# Patient Record
Sex: Female | Born: 1965 | Race: White | Hispanic: No | Marital: Married | Smoking: Former smoker
Health system: Southern US, Community
[De-identification: ages and names within clinical notes are randomized; demographics above are authoritative.]

## PROBLEM LIST (undated history)

## (undated) DIAGNOSIS — F909 Attention-deficit hyperactivity disorder, unspecified type: Secondary | ICD-10-CM

## (undated) DIAGNOSIS — K429 Umbilical hernia without obstruction or gangrene: Secondary | ICD-10-CM

## (undated) DIAGNOSIS — K219 Gastro-esophageal reflux disease without esophagitis: Secondary | ICD-10-CM

## (undated) DIAGNOSIS — F329 Major depressive disorder, single episode, unspecified: Secondary | ICD-10-CM

## (undated) DIAGNOSIS — F32A Depression, unspecified: Secondary | ICD-10-CM

## (undated) DIAGNOSIS — A630 Anogenital (venereal) warts: Secondary | ICD-10-CM

## (undated) HISTORY — DX: Attention-deficit hyperactivity disorder, unspecified type: F90.9

## (undated) HISTORY — DX: Anogenital (venereal) warts: A63.0

## (undated) HISTORY — DX: Major depressive disorder, single episode, unspecified: F32.9

## (undated) HISTORY — DX: Gastro-esophageal reflux disease without esophagitis: K21.9

## (undated) HISTORY — PX: CHOLECYSTECTOMY: SHX55

## (undated) HISTORY — DX: Depression, unspecified: F32.A

## (undated) HISTORY — PX: UMBILICAL HERNIA REPAIR: SHX2598

---

## 2005-10-14 ENCOUNTER — Ambulatory Visit: Payer: Self-pay | Admitting: Obstetrics and Gynecology

## 2005-11-23 ENCOUNTER — Ambulatory Visit: Payer: Self-pay | Admitting: Obstetrics and Gynecology

## 2006-04-18 HISTORY — PX: ENDOMETRIAL ABLATION: SHX621

## 2007-10-30 ENCOUNTER — Ambulatory Visit: Payer: Self-pay | Admitting: Obstetrics and Gynecology

## 2007-10-30 ENCOUNTER — Encounter: Payer: Self-pay | Admitting: Family Medicine

## 2008-01-23 ENCOUNTER — Encounter: Payer: Self-pay | Admitting: Family Medicine

## 2008-11-06 ENCOUNTER — Ambulatory Visit: Payer: Self-pay | Admitting: Obstetrics and Gynecology

## 2009-04-29 ENCOUNTER — Encounter: Payer: Self-pay | Admitting: Family Medicine

## 2009-10-28 LAB — CONVERTED CEMR LAB: Pap Smear: NORMAL

## 2009-11-03 ENCOUNTER — Encounter: Payer: Self-pay | Admitting: Family Medicine

## 2009-11-24 ENCOUNTER — Ambulatory Visit: Payer: Self-pay | Admitting: Obstetrics and Gynecology

## 2009-12-09 ENCOUNTER — Ambulatory Visit: Payer: Self-pay | Admitting: Obstetrics and Gynecology

## 2010-03-25 ENCOUNTER — Ambulatory Visit: Payer: Self-pay | Admitting: Family Medicine

## 2010-03-25 DIAGNOSIS — F329 Major depressive disorder, single episode, unspecified: Secondary | ICD-10-CM

## 2010-03-25 DIAGNOSIS — N390 Urinary tract infection, site not specified: Secondary | ICD-10-CM | POA: Insufficient documentation

## 2010-03-25 LAB — CONVERTED CEMR LAB
Bilirubin Urine: NEGATIVE
Glucose, Urine, Semiquant: NEGATIVE
Urobilinogen, UA: 0.2
pH: 6.5

## 2010-03-26 ENCOUNTER — Telehealth: Payer: Self-pay | Admitting: Family Medicine

## 2010-03-29 ENCOUNTER — Encounter: Payer: Self-pay | Admitting: Family Medicine

## 2010-04-13 ENCOUNTER — Ambulatory Visit: Payer: Self-pay | Admitting: Psychology

## 2010-04-16 ENCOUNTER — Ambulatory Visit
Admission: RE | Admit: 2010-04-16 | Discharge: 2010-04-16 | Payer: Self-pay | Source: Home / Self Care | Attending: Psychology | Admitting: Psychology

## 2010-04-21 ENCOUNTER — Encounter: Payer: Self-pay | Admitting: Family Medicine

## 2010-04-21 ENCOUNTER — Ambulatory Visit
Admission: RE | Admit: 2010-04-21 | Discharge: 2010-04-21 | Payer: Self-pay | Source: Home / Self Care | Attending: Psychology | Admitting: Psychology

## 2010-04-22 ENCOUNTER — Telehealth: Payer: Self-pay | Admitting: Family Medicine

## 2010-04-27 ENCOUNTER — Telehealth: Payer: Self-pay | Admitting: Family Medicine

## 2010-04-29 ENCOUNTER — Telehealth: Payer: Self-pay | Admitting: Family Medicine

## 2010-04-29 ENCOUNTER — Ambulatory Visit: Admit: 2010-04-29 | Payer: Self-pay | Admitting: Family Medicine

## 2010-05-05 ENCOUNTER — Telehealth: Payer: Self-pay | Admitting: Family Medicine

## 2010-05-14 ENCOUNTER — Telehealth: Payer: Self-pay | Admitting: Family Medicine

## 2010-05-17 ENCOUNTER — Telehealth: Payer: Self-pay | Admitting: Family Medicine

## 2010-05-19 NOTE — Assessment & Plan Note (Signed)
Summary: NEW PT TO ESTABH/DLO   Vital Signs:  Patient profile:   45 year old female Height:      64 inches Weight:      231.50 pounds BMI:     39.88 Temp:     97.4 degrees F oral Pulse rate:   76 / minute Pulse rhythm:   regular BP sitting:   110 / 78  (right arm) Cuff size:   regular  Vitals Entered By: Linde Gillis CMA Duncan Dull) (March 25, 2010 10:54 AM) CC: new patient, ? UTI   History of Present Illness: 45 yo here to establish care.  1.  ?UTI- last 3 days increased urinary frequency, urgency, and dysuria.  No hematuria, back pain, nausea, vomiting or fevers.   Does not have h/o recurrent UTIs.  No vaginal discharge or irritation.  2.  ADHD- diagnosed as a teenager but has never been on treatment for it.  Both of her children are on Focalin, formally evaluated through psych. Has a new teaching job, difficulty completing tasks and focusing.  Would like to try a stimulant.  3.  Depression- started Lexapro 20 mg last year, vast improvement in her symptoms.  No anxiety or tearfulness.  Less irritability.  Preventive Screening-Counseling & Management  Alcohol-Tobacco     Smoking Status: quit  Current Medications (verified): 1)  Lexapro 20 Mg Tabs (Escitalopram Oxalate) .... Take One Tablet By Mouth Daily 2)  Cyanocobalamin 1000 Mcg/ml Soln (Cyanocobalamin) .... One Injection Every 14 Days 3)  Focalin Xr 10 Mg Xr24h-Cap (Dexmethylphenidate Hcl) .Marland Kitchen.. 1 Tab By Mouth Every Morning. 4)  Bactrim Ds 800-160 Mg Tabs (Sulfamethoxazole-Trimethoprim) .Marland Kitchen.. 1 Tab By Mouth Two Times A Day X 3 Days 5)  Pyridium 100 Mg Tabs (Phenazopyridine Hcl) .Marland Kitchen.. 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 Eyes:  Denies blurring. ENT:  Denies difficulty swallowing. CV:  Denies chest pain or discomfort. Resp:  Denies shortness of breath. GI:  Complains of abdominal pain; denies change in bowel habits, diarrhea, nausea, and vomiting. GU:  Complains of dysuria and urinary frequency; denies discharge, hematuria, and incontinence. MS:  Denies joint pain, joint redness, and joint swelling. Derm:  Denies rash. Neuro:  Denies headaches. Psych:  Denies anxiety, depression, sense of great danger, suicidal thoughts/plans, thoughts of violence, unusual visions or sounds, and thoughts /plans of harming others. Endo:  Denies cold intolerance and heat intolerance. Heme:  Denies abnormal bruising and bleeding.  Physical Exam  General:  alert, well-developed, and overweight-appearing.   Head:  normocephalic, atraumatic, no abnormalities observed, and no abnormalities palpated.   Eyes:  vision grossly intact, pupils equal, pupils round, and pupils reactive to light.   Ears:  R ear normal and L ear normal.   Nose:  no external deformity.  Mouth:  good dentition and no gingival abnormalities.   Neck:  No deformities, masses, or tenderness noted. Lungs:  Normal respiratory effort, chest expands symmetrically. Lungs are clear to auscultation, no crackles or wheezes. Heart:  Normal rate and regular rhythm. S1 and S2 normal without gallop, murmur, click, rub or other extra sounds. Abdomen:  Bowel sounds positive,abdomen soft and non-tender without masses,  organomegaly or hernias noted. Msk:  No deformity or scoliosis noted of thoracic or lumbar spine.   Extremities:  no edema Neurologic:  alert & oriented X3 and gait normal.   Skin:  Intact without suspicious lesions or rashes Psych:  Cognition and judgment appear intact. Alert and cooperative with normal attention span and concentration. No apparent delusions, illusions, hallucinations   Impression & Recommendations:  Problem # 1:  ATTENTION DEFICIT DISORDER (ICD-314.00) Assessment Deteriorated Will start stimulant, focalin. Pt signed controlled substance contract. Follow up in one month.  Problem # 2:  UTI (ICD-599.0) Assessment: New UA pos for UTI. Treat with 3 day course of Bactrim and Pyridium for uncomplicated cystitis. Her updated medication list for this problem includes:    Bactrim Ds 800-160 Mg Tabs (Sulfamethoxazole-trimethoprim) .Marland Kitchen... 1 tab by mouth two times a day x 3 days    Pyridium 100 Mg Tabs (Phenazopyridine hcl) .Marland Kitchen... 1 tab by mouth three times a day x 2 days  Problem # 3:  DEPRESSION (ICD-311) Assessment: Unchanged Appears stable on Lexapro 20 mg daily. Her updated medication list for this problem includes:    Lexapro 20 Mg Tabs (Escitalopram oxalate) .Marland Kitchen... Take one tablet by mouth daily  Complete Medication List: 1)  Lexapro 20 Mg Tabs (Escitalopram oxalate) .... Take one tablet by mouth daily 2)  Cyanocobalamin 1000 Mcg/ml Soln (Cyanocobalamin) .... One injection every 14 days 3)  Focalin Xr 10 Mg Xr24h-cap (Dexmethylphenidate hcl) .Marland Kitchen.. 1 tab by mouth every morning. 4)  Bactrim Ds 800-160 Mg Tabs (Sulfamethoxazole-trimethoprim) .Marland Kitchen.. 1 tab by mouth two times a day x 3 days 5)  Pyridium 100 Mg Tabs (Phenazopyridine hcl) .Marland Kitchen.. 1 tab by mouth three times a day x 2 days  Other Orders: UA Dipstick w/o Micro (manual) (16109) Prescriptions: PYRIDIUM 100 MG TABS (PHENAZOPYRIDINE HCL) 1 tab by mouth three times a day x 2 days  #6 x 0   Entered and Authorized by:    Ruthe Mannan MD   Signed by:   Ruthe Mannan MD on 03/25/2010   Method used:   Print then Give to Patient   RxID:   [id number] BACTRIM DS 800-160 MG TABS (SULFAMETHOXAZOLE-TRIMETHOPRIM) 1 tab by mouth two times a day x 3 days  #6 x 0   Entered and Authorized by:   Ruthe Mannan MD   Signed by:   Ruthe Mannan MD on 03/25/2010   Method used:   Print then Give to Patient   RxID:   [id number] FOCALIN XR 10 MG XR24H-CAP (DEXMETHYLPHENIDATE HCL) 1 tab by mouth every morning.  #30 x 0   Entered and Authorized by:   Ruthe Mannan MD   Signed by:   Ruthe Mannan MD on 03/25/2010   Method used:   Print then Give to Patient   RxID:   [id number]    Orders Added: 1)  UA Dipstick w/o Micro (manual) [81002] 2)  New Patient Level III [25366]    Current Allergies (reviewed today): No known allergies   Laboratory Results   Urine Tests  Date/Time Received: March 25, 2010 11:06 AM   Routine Urinalysis  Color: yellow Appearance: Cloudy Glucose: negative   (Normal Range: Negative) Bilirubin: negative   (Normal Range: Negative) Ketone: negative   (Normal Range: Negative) Spec. Gravity: <1.005   (Normal Range: 1.003-1.035) Blood: moderate   (Normal Range: Negative) pH: 6.5   (Normal Range: 5.0-8.0) Protein: trace   (Normal Range: Negative) Urobilinogen: 0.2   (Normal Range: 0-1) Nitrite: negative   (Normal Range: Negative) Leukocyte Esterace: moderate   (Normal Range: Negative)       Flu Vaccine Result Date:  02/23/2010 Flu Vaccine Result:  historical TD Result Date:  03/28/2007 TD Result:  historical PAP Result Date:  10/28/2009 PAP Result:  normal, historical PAP Next Due:  3 yr Mammogram Result Date:  10/22/2009 Mammogram Result:  normal, historical Mammogram Next Due:  1 yr

## 2010-05-20 NOTE — Progress Notes (Signed)
Summary: concerta  Phone Note Call from Patient Call back at Work Phone [phone]   Caller: Patient Call For: Ruthe Mannan MD Summary of Call: Patient is taking the concerta 10 mg, she says that she is able to tell a difference, but feels that she needs more. She is asking if she could take 20 mg instead of 10mg . Please advise.  Initial call taken by: Melody Comas,  May 05, 2010 11:52 AM  Follow-up for Phone Call        she could try to double up, if effective call us back for rx. Ruthe Mannan MD  May 05, 2010 12:10 PM  Advised pt. Follow-up by: Lowella Petties CMA, AAMA,  May 05, 2010 3:09 PM

## 2010-05-20 NOTE — Progress Notes (Signed)
Summary: prior auth denied for focalin  Phone Note Other Incoming   Caller: [facility] Summary of Call: Prior Berkley Harvey was denied for focalin because at the time the request was sent in pt had not yet been evaluated by psych.  I changed the answer on the request after she was evaluated but they dont re-review the request, we will need to do an appeal.  The address to mail the appeal to is [facility], [address] [address], [place] [address].  Attn Appeals and Grievences. Initial call taken by: Lowella Petties CMA, AAMA,  April 27, 2010 9:55 AM  Follow-up for Phone Call        [facility], please follow up.  Pt was evaluated with psych so appeal should be easy. thanks. Ruthe Mannan MD  April 27, 2010 10:35 AM     Additional Follow-up for Phone Call Additional follow up Details #1::        I would call Ms. Kunsman to see if she would be ok trying adderall first since it is generic.  Not sure we can get this approved if they want to know what she has tried in past since she has not tried anything. Ruthe Mannan MD  April 27, 2010 11:28 AM  Left message on machine at home for patient to return call.  Linde Gillis CMA Duncan Dull)  April 27, 2010 12:01 PM   Left message on machine at home for patient to return call.  Linde Gillis CMA Duncan Dull)  April 28, 2010 8:39 AM   Patient called back and would like to try Concerta instead of Adderall, but would be willing to try Adderall if Concerta is not appropriate.  Uses [facility] pharmacy.  Please advise.   Additional Follow-up by: Linde Gillis CMA Duncan Dull),  April 28, 2010 12:04 PM    Additional Follow-up for Phone Call Additional follow up Details #2::    Generic for concerta filled, rx for pt to pick up. Ruthe Mannan MD  April 28, 2010 12:07 PM  Patient notified as instructed via telephone, Rx left at front desk for pick up.  Follow-up by: Linde Gillis CMA Duncan Dull),  April 28, 2010 12:16 PM  New/Updated Medications: METHYLPHENIDATE HCL 10 MG  TABS (METHYLPHENIDATE HCL) 1 tab by mouth daily. Prescriptions: METHYLPHENIDATE HCL 10 MG TABS (METHYLPHENIDATE HCL) 1 tab by mouth daily.  #30 x 0   Entered and Authorized by:   Ruthe Mannan MD   Signed by:   Ruthe Mannan MD on 04/28/2010   Method used:   Print then Give to Patient   RxID:   [id number]

## 2010-05-20 NOTE — Progress Notes (Signed)
Summary: regarding concerta  Phone Note From Pharmacy   Caller: [facility] Summary of Call: Pharmacy is asking if you meant to prescribe concerta or ritalin, I told them concerta, but that doesnt come in 10 mg's.  Please advise.          Lowella Petties CMA, AAMA  April 29, 2010 10:06 AM   Follow-up for Phone Call        ritalin is fine, they are both  methylphenidate and 10 mg is appropriate. Ruthe Mannan MD  April 29, 2010 10:26 AM    Prior Berkley Harvey approval given over the phone, approval letter placed on doctor's desk for signature and scanning.  Advised pharmacy. Follow-up by: Lowella Petties CMA, AAMA,  May 03, 2010 8:18 AM

## 2010-05-20 NOTE — Progress Notes (Signed)
Summary: dx'd with ADD  Phone Note Call from Patient Call back at [phone], [phone]   Caller: Patient Summary of Call: Pt states she saw Dr. Laymond Purser and was evaluated for ADD, and was dx'd as having it.  I re-faxed the prior auth form for focalin to [facility].       Lowella Petties CMA, AAMA  April 22, 2010 12:30 PM   Follow-up for Phone Call        ok. thank you. Ruthe Mannan MD  April 22, 2010 1:36 PM

## 2010-05-20 NOTE — Progress Notes (Signed)
Summary: concerta  Phone Note Call from Patient Call back at Work Phone [phone]   Caller: Patient Call For: Ruthe Mannan MD Summary of Call: Patient is now taking 30 mg of concerta she says that it is really helping. Patient uses [facility] in [place].  Initial call taken by: Melody Comas,  May 14, 2010 11:40 AM  Follow-up for Phone Call        Rx left at front desk for patient pick up.  Patient notified via telephone. Follow-up by: Linde Gillis CMA Duncan Dull),  May 14, 2010 11:46 AM    New/Updated Medications: CONCERTA 36 MG CR-TABS (METHYLPHENIDATE HCL) 1 tab by mouth daily. Prescriptions: CONCERTA 36 MG CR-TABS (METHYLPHENIDATE HCL) 1 tab by mouth daily.  #30 x 0   Entered and Authorized by:   Ruthe Mannan MD   Signed by:   Ruthe Mannan MD on 05/14/2010   Method used:   Print then Give to Patient   RxID:   [id number]

## 2010-05-20 NOTE — Medication Information (Signed)
Summary: Declined/[facility]  Declined/[facility]   Imported By: Lester Clayton 04/09/2010 08:50:44  _____________________________________________________________________  External Attachment:    Type:   Image     Comment:   External Document

## 2010-05-20 NOTE — Miscellaneous (Signed)
Summary: Controlled Substance Agreement  Controlled Substance Agreement   Imported By: Lanelle Bal 04/01/2010 12:51:54  _____________________________________________________________________  External Attachment:    Type:   Image     Comment:   External Document

## 2010-05-20 NOTE — Progress Notes (Signed)
Summary: prior Krystal Mays is needed for focalin  Phone Note From Pharmacy   Caller: [facility] Summary of Call: Prior auth is needed for focalin, form is on your desk. Initial call taken by: Lowella Petties CMA, AAMA,  March 26, 2010 4:10 PM  Follow-up for Phone Call        In my box. Ruthe Mannan MD  March 29, 2010 7:32 AM  Form faxed.       Lowella Petties CMA, AAMA  March 29, 2010 8:56 AM   Additional Follow-up for Phone Call Additional follow up Details #1::        Pt called and said that she received a letter from her insurance stating that focalin has been denied for coverage.  She is asking if she can be changed to something else. I advised her to check with her insurance company to see what they prefer.  Lowella Petties CMA, AAMA  April 02, 2010 4:41 PM     New Problems: ? of ADD (ICD-314.00)   Additional Follow-up for Phone Call Additional follow up Details #2::    agreed.  please let her know I will not be back unti next weds. Ruthe Mannan MD  April 06, 2010 7:13 AM  Pt states her insurance will not approve any stimulants for ADD unless she has been evaluated by psych- which she has not.Please advise on what to do next.  Pt knows that you are out till next week.             Lowella Petties CMA, AAMA  April 06, 2010 4:13 PM   Additional Follow-up for Phone Call Additional follow up Details #3:: Details for Additional Follow-up Action Taken: will place referral for formal eval. Ruthe Mannan MD  April 07, 2010 5:45 AM  Appt has been scheduled.               Lowella Petties CMA, AAMA  April 07, 2010 8:22 AM   New Problems: ? of ADD (ICD-314.00)

## 2010-05-24 ENCOUNTER — Telehealth: Payer: Self-pay | Admitting: Family Medicine

## 2010-05-24 ENCOUNTER — Encounter: Payer: Self-pay | Admitting: Family Medicine

## 2010-05-24 ENCOUNTER — Ambulatory Visit (INDEPENDENT_AMBULATORY_CARE_PROVIDER_SITE_OTHER): Payer: BC Managed Care – PPO | Admitting: Family Medicine

## 2010-05-24 DIAGNOSIS — F988 Other specified behavioral and emotional disorders with onset usually occurring in childhood and adolescence: Secondary | ICD-10-CM | POA: Insufficient documentation

## 2010-05-26 NOTE — Progress Notes (Signed)
Summary: regarding concerta  Phone Note Refill Request Message from:  Pharmacy  Refills Requested: Medication #1:  CONCERTA 36 MG CR-TABS 1 tab by mouth daily.. Concerta is going to need a prior auth, pharmacy is asking if generic ritalin can be given instead, at a higher dose then pt was originally prescribed.  According to chart notes pt is now taking 30 mg's.  Initial call taken by: Lowella Petties CMA, AAMA,  May 17, 2010 9:53 AM Caller: [facility]  Follow-up for Phone Call        please call pt to discuss what she would like to do Ruthe Mannan MD  May 17, 2010 10:03 AM  Patient would like [place] to get prior auth on Concerta.  In the mean time she wants to know if she can go up on her Ritalin dose.  She says that the 30mg  are helping alot but feels she may need the increase.  Please advise.  Will send to Jacki Cones to start PA process.  Linde Gillis CMA Duncan Dull)  May 17, 2010 10:44 AM    Additional Follow-up for Phone Call Additional follow up Details #1::        No I do not feel comfortable increasing it again.  We just increased it. Ruthe Mannan MD  May 17, 2010 10:45 AM  Patient advised as instructed via telephone, she is ok with Dr. Dayton Martes not increasing her Ritalin.  She would just like a refill on Ritalin (generic) until her PA of Concerta is approved.  Linde Gillis CMA Duncan Dull)  May 17, 2010 11:07 AM   Rx left at front desk for pick up, patient advised.  Additional Follow-up by: Linde Gillis CMA (AAMA),  May 17, 2010 11:49 AM    New/Updated Medications: RITALIN LA 30 MG XR24H-CAP (METHYLPHENIDATE HCL) 1 tab by mouth daily Prescriptions: RITALIN LA 30 MG XR24H-CAP (METHYLPHENIDATE HCL) 1 tab by mouth daily  #30 x 0   Entered and Authorized by:   Ruthe Mannan MD   Signed by:   Ruthe Mannan MD on 05/17/2010   Method used:   Print then Give to Patient   RxID:   [id number]

## 2010-05-26 NOTE — Progress Notes (Signed)
Summary: prior Berkley Harvey is needed for concerta  Phone Note From Pharmacy   Caller: [facility] Summary of Call: Prior Berkley Harvey is needed for concerta, form is on your desk. Initial call taken by: Lowella Petties CMA, AAMA,  May 17, 2010 1:17 PM  Follow-up for Phone Call        in my box. Ruthe Mannan MD  May 17, 2010 3:45 PM  Form faxed.                 Lowella Petties CMA, AAMA  May 17, 2010 3:58 PM   Additional Follow-up for Phone Call Additional follow up Details #1::        Prior auth given for concerta, approval letter placed on doctor's desk for signature and scanning. Additional Follow-up by: Lowella Petties CMA, AAMA,  May 18, 2010 9:53 AM

## 2010-05-31 ENCOUNTER — Encounter: Payer: Self-pay | Admitting: Family Medicine

## 2010-06-03 NOTE — Progress Notes (Signed)
Summary: Approval for Ritalin LA  Phone Note Outgoing Call Call back at [phone]   Call placed by: Linde Gillis CMA Duncan Dull),  May 24, 2010 4:46 PM Call placed to: [facility] Summary of Call: Spoke with Rosanne Ashing at [facility] and was advised that Ritalin LA 30mg  is approved and should now go through at patients pharmacy.  Case number [id number].  Called [facility] to make them aware as well. Initial call taken by: Linde Gillis CMA Duncan Dull),  May 24, 2010 4:47 PM

## 2010-06-03 NOTE — Assessment & Plan Note (Signed)
Summary: FOLLOW UP/[facility]    Vital Signs:  Patient profile:   45 year old female Height:      64 inches Weight:      233.50 pounds BMI:     40.23 Temp:     97.7 degrees F oral Pulse rate:   71 / minute Pulse rhythm:   regular BP sitting:   110 / 70  (right arm) Cuff size:   large  Vitals Entered By: Linde Gillis CMA Duncan Dull) (May 24, 2010 4:16 PM)  History of Present Illness: 45 yo here follow up.    2.  ADHD- diagnosed as a teenager but has never been on treatment for it.  Both of her children are on Focalin, formally evaluated through psych. Has a new teaching job, difficulty completing tasks and focusing.    Had formal evaluation by Dr. Laymond Purser, diagnosed with ADD.  Started on Ritalin 10 mg daily but she felt it was not enough.  wanted to try Concerta.  Started Concerta 36 mg daily but having headaches and nausea, feels less focused. Would like to try higher dose of Ritalin or Focalin.   3.  Depression- started Lexapro 20 mg last year, vast improvement in her symptoms.  No anxiety or tearfulness.  Less irritability.  CC: follow up ADD, depression   Current Medications (verified): 1)  Lexapro 20 Mg Tabs (Escitalopram Oxalate) .... Take One Tablet By Mouth Daily 2)  Cyanocobalamin 1000 Mcg/ml Soln (Cyanocobalamin) .... One Injection Every 14 Days 3)  Pyridium 100 Mg Tabs (Phenazopyridine Hcl) .Marland Kitchen.. 1 Tab By Mouth Three Times A Day X 2 Days 4)  Concerta 36 Mg Cr-Tabs (Methylphenidate Hcl) .Marland Kitchen.. 1 Tab By Mouth Daily.  Allergies (verified): No Known Drug Allergies  Orders Added: 1)  Est. Patient Level IV [16109]  Review of Systems      See HPI General:  Denies malaise. Psych:  Denies mental problems, panic attacks, sense of great danger, suicidal thoughts/plans, thoughts of violence, unusual visions or sounds, and thoughts /plans of harming others.   Physical Exam  General:  alert, well-developed, and overweight-appearing.   Psych:  Cognition and judgment appear  intact. Alert and cooperative with normal attention span and concentration. No apparent delusions, illusions, hallucinations   Impression & Recommendations:  Problem # 1:  ADD (ICD-314.00) Assessment Deteriorated Time spent with patient 25 minutes, more than 50% of this time was spent counseling patient on ADD treatment.  Printed out rx for focalin which I then destroyed because pt would prefer to not have to go through another prior authorization battle at this time. Will increase dose of Ritalin (Rx already at pharmacy).  Pt to follow up in one month.  Complete Medication List: 1)  Lexapro 20 Mg Tabs (Escitalopram oxalate) .... Take one tablet by mouth daily 2)  Cyanocobalamin 1000 Mcg/ml Soln (Cyanocobalamin) .... One injection every 14 days 3)  Pyridium 100 Mg Tabs (Phenazopyridine hcl) .Marland Kitchen.. 1 tab by mouth three times a day x 2 days 4)  Concerta 36 Mg Cr-tabs (Methylphenidate hcl) .Marland Kitchen.. 1 tab by mouth daily.  Prescriptions: FOCALIN XR 20 MG XR24H-CAP (DEXMETHYLPHENIDATE HCL) 1 tab by mouth daily.  #30 x 0   Entered and Authorized by:   Ruthe Mannan MD   Signed by:   Ruthe Mannan MD on 05/24/2010   Method used:   Print then Give to Patient   RxID:   [id number]   Current Allergies (reviewed today): No known allergies

## 2010-06-09 NOTE — Letter (Signed)
Summary: [facility]   Imported By: Kassie Mends 06/04/2010 10:55:41  _____________________________________________________________________  External Attachment:    Type:   Image     Comment:   External Document

## 2010-06-09 NOTE — Miscellaneous (Signed)
Summary: prevention update  Clinical Lists Changes  Observations: Added new observation of MAMMO DUE: 10/30/2010 (05/31/2010 13:43) Added new observation of PAP DUE: 11/03/2012 (05/31/2010 13:43) Added new observation of LAST PAP DAT: 11/03/2009 (11/03/2009 13:44) Added new observation of PAP SMEAR: normal (11/03/2009 13:44) Added new observation of LAST MAM DAT: 10/29/2009 (10/29/2009 13:44) Added new observation of MAMMOGRAM: normal (10/29/2009 13:44)     Last PAP:  normal, historical (10/28/2009 11:55:30 AM) PAP Result Date:  11/03/2009 PAP Result:  normal PAP Next Due:  3 yr Last Mammogram:  normal, historical (10/22/2009 11:56:00 AM) Mammogram Result Date:  10/29/2009 Mammogram Result:  normal Mammogram Next Due:  1 yr

## 2010-06-10 ENCOUNTER — Encounter: Payer: Self-pay | Admitting: Family Medicine

## 2010-06-10 ENCOUNTER — Ambulatory Visit (INDEPENDENT_AMBULATORY_CARE_PROVIDER_SITE_OTHER): Payer: BC Managed Care – PPO | Admitting: Family Medicine

## 2010-06-10 ENCOUNTER — Telehealth: Payer: Self-pay | Admitting: Family Medicine

## 2010-06-10 ENCOUNTER — Other Ambulatory Visit: Payer: Self-pay | Admitting: Family Medicine

## 2010-06-10 ENCOUNTER — Ambulatory Visit: Payer: Self-pay | Admitting: Family Medicine

## 2010-06-10 DIAGNOSIS — K8021 Calculus of gallbladder without cholecystitis with obstruction: Secondary | ICD-10-CM | POA: Insufficient documentation

## 2010-06-10 DIAGNOSIS — R1011 Right upper quadrant pain: Secondary | ICD-10-CM

## 2010-06-10 LAB — CBC WITH DIFFERENTIAL/PLATELET
Basophils Relative: 0.4 % (ref 0.0–3.0)
Eosinophils Relative: 1.5 % (ref 0.0–5.0)
Hemoglobin: 14.3 g/dL (ref 12.0–15.0)
Lymphocytes Relative: 26 % (ref 12.0–46.0)
Monocytes Relative: 8.4 % (ref 3.0–12.0)
Neutro Abs: 4.8 10*3/uL (ref 1.4–7.7)
Neutrophils Relative %: 63.7 % (ref 43.0–77.0)
RBC: 4.23 Mil/uL (ref 3.87–5.11)
WBC: 7.5 10*3/uL (ref 4.5–10.5)

## 2010-06-10 LAB — HEPATIC FUNCTION PANEL
ALT: 15 U/L (ref 0–35)
AST: 16 U/L (ref 0–37)
Albumin: 4.4 g/dL (ref 3.5–5.2)
Alkaline Phosphatase: 52 U/L (ref 39–117)
Bilirubin, Direct: 0.1 mg/dL (ref 0.0–0.3)
Total Protein: 6.7 g/dL (ref 6.0–8.3)

## 2010-06-10 LAB — BASIC METABOLIC PANEL
BUN: 18 mg/dL (ref 6–23)
Calcium: 9 mg/dL (ref 8.4–10.5)
Chloride: 110 mEq/L (ref 96–112)
Creatinine, Ser: 0.9 mg/dL (ref 0.4–1.2)
GFR: 72.55 mL/min (ref >60.00)

## 2010-06-10 LAB — LIPASE: Lipase: 29 U/L (ref 11.0–59.0)

## 2010-06-11 ENCOUNTER — Ambulatory Visit: Payer: Self-pay | Admitting: Surgery

## 2010-06-15 NOTE — Progress Notes (Signed)
Summary: call report   Phone Note Call from Patient   Caller: Aggie Cosier  Call For: Ruthe Mannan MD Summary of Call: Aggie Cosier with [facility] - Call Report- abdominal ultrasound showed that there is a Large impacted stone at neck of gallbladder and dilated common bile duct.  Initial call taken by: Melody Comas,  June 10, 2010 10:38 AM  Follow-up for Phone Call        left voicemail for pt to return call.  Will place immediate referral for surgery evaluation. Ruthe Mannan MD  June 10, 2010 10:40 AM  Patient advised as instructed via telephone.  Shirlee Limerick will speak with her to make surgery referral.    Follow-up by: Linde Gillis CMA Duncan Dull),  June 10, 2010 11:05 AM  Additional Follow-up for Phone Call Additional follow up Details #1::        Appt made with Dr Renda Rolls at [facility] for 06/10/2010 at 11:30 notes were faxed to [facility] attn at [phone]. Additional Follow-up by: Carlton Adam,  June 10, 2010 11:14 AM  New Problems: CHOLELITHIASIS, WITH OBSTRUCTION [phone])   Additional Follow-up for Phone Call Additional follow up Details #2::    thank you! Ruthe Mannan MD  June 10, 2010 11:16 AM   New Problems: CHOLELITHIASIS, WITH OBSTRUCTION [phone])

## 2010-06-15 NOTE — Assessment & Plan Note (Signed)
Summary: BACK PAIN/CLE  BCBS   Vital Signs:  Patient profile:   45 year old female Height:      64 inches Weight:      233.50 pounds BMI:     40.23 Temp:     98.4 degrees F oral Pulse rate:   68 / minute Pulse rhythm:   regular BP sitting:   110 / 62  (left arm) Cuff size:   large  Vitals Entered By: Linde Gillis CMA Duncan Dull) (June 10, 2010 8:19 AM) CC: stomach and back pain   History of Present Illness: 45 yo here for acute onset of abdominal/back pain.  Started two days ago after eating chicken wings. Right upper quadrant abdominal pain that radiated to her back. Associated with nausea, no vomiting. Never had anything like this before. No fevers or chills. No diarrhea, blood or changes in her stool. This morning, feels much better.  +FH of gallstones.  Current Medications (verified): 1)  Lexapro 20 Mg Tabs (Escitalopram Oxalate) .... Take One Tablet By Mouth Daily 2)  Cyanocobalamin 1000 Mcg/ml Soln (Cyanocobalamin) .... One Injection Every 14 Days 3)  Omeprazole 20 Mg Cpdr (Omeprazole) .... Take One Tablet By Mouth Daily 4)  Ritalin La 30 Mg Xr24h-Cap (Methylphenidate Hcl) .Marland Kitchen.. 1 Tab By Mouth Every Morning.  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 GI:  Complains of abdominal pain and nausea; denies constipation and vomiting.  Physical Exam  General:  alert, well-developed, and overweight-appearing.   Abdomen:  mildly TTP  diffusely, neg murphy's soft and no hepatomegaly.   Skin:  Intact without suspicious lesions or rashes Psych:  Cognition and judgment appear intact. Alert and cooperative with normal attention span and concentration. No apparent delusions, illusions, hallucinations   Impression & Recommendations:  Problem # 1:  RUQ PAIN (ICD-789.01) Assessment New seems most consistent with biliary colic. Wil check labs like lipase, LFTs, CBC to rule out other causes. Abdominal ultrasound order placed. Orders: Venipuncture (04540) TLB-CBC Platelet - w/Differential (85025-CBCD) TLB-Hepatic/Liver Function Pnl (80076-HEPATIC) TLB-BMP (Basic Metabolic Panel-BMET) (80048-METABOL) TLB-Lipase (83690-LIPASE) Radiology Referral (Radiology)  Complete Medication List: 1)  Lexapro 20 Mg Tabs (Escitalopram oxalate) .... Take one tablet by mouth daily 2)  Cyanocobalamin 1000 Mcg/ml Soln (Cyanocobalamin) .... One injection every 14 days 3)  Omeprazole 20 Mg Cpdr (Omeprazole) .... Take one tablet by mouth daily 4)  Ritalin La 30 Mg Xr24h-cap (Methylphenidate hcl) .Marland Kitchen.. 1 tab by mouth every morning.  Patient Instructions: 1)  Great to see you. 2)  Please stop by to see Shirlee Limerick on your way out. Prescriptions: RITALIN LA 30 MG XR24H-CAP (METHYLPHENIDATE HCL) 1 tab by mouth every morning.  #30 x 0   Entered and Authorized by:   Ruthe Mannan MD   Signed by:   Ruthe Mannan MD on 06/10/2010   Method used:   Print then Give to Patient   RxID:   [id number]    Orders Added: 1)  Venipuncture [08657] 2)  TLB-CBC Platelet - w/Differential [85025-CBCD] 3)  TLB-Hepatic/Liver Function  Pnl [80076-HEPATIC] 4)  TLB-BMP (Basic Metabolic Panel-BMET) [80048-METABOL] 5)  TLB-Lipase [83690-LIPASE] 6)  Radiology Referral [Radiology] 7)  Est. Patient Level IV [16109]    Current Allergies (reviewed today): No known allergies

## 2010-06-15 NOTE — Letter (Signed)
Summary: E-mail from Dr.Perrin to Dr.Jaramie Bastos  E-mail from Dr.Perrin to Dr.Deasiah Hagberg   Imported By: Beau Fanny 06/10/2010 10:40:19  _____________________________________________________________________  External Attachment:    Type:   Image     Comment:   External Document  Appended Document: E-mail from Dr.Perrin to Dr.Chanoch Mccleery ADHD

## 2010-06-29 NOTE — Consult Note (Signed)
Summary: [facility]  [facility]   Imported By: Maryln Gottron 06/24/2010 13:33:03  _____________________________________________________________________  External Attachment:    Type:   Image     Comment:   External Document

## 2010-08-04 ENCOUNTER — Other Ambulatory Visit: Payer: Self-pay | Admitting: *Deleted

## 2010-08-04 MED ORDER — METHYLPHENIDATE HCL ER (LA) 30 MG PO CP24: 30.0000 mg | ORAL_CAPSULE | ORAL | Status: DC

## 2010-08-05 MED ORDER — METHYLPHENIDATE HCL ER (LA) 30 MG PO CP24: 30.0000 mg | ORAL_CAPSULE | ORAL | Status: AC

## 2010-08-05 MED ORDER — METHYLPHENIDATE HCL ER (LA) 30 MG PO CP24: ORAL_CAPSULE | ORAL | Status: DC

## 2010-08-05 NOTE — Telephone Encounter (Signed)
Nikki, has this been taken care of? 

## 2010-08-05 NOTE — Telephone Encounter (Signed)
Left message on cell phone voicemail that Rx's are ready for pick up will be left at front desk.

## 2010-10-12 ENCOUNTER — Other Ambulatory Visit: Payer: Self-pay | Admitting: *Deleted

## 2010-10-12 MED ORDER — METHYLPHENIDATE HCL ER (LA) 30 MG PO CP24: 30.0000 mg | ORAL_CAPSULE | ORAL | Status: DC

## 2010-10-12 NOTE — Telephone Encounter (Signed)
Pt is asking for written script for lexapro 10 mg's, says she had gotten these from her gyn, would like for you to start prescribing.  She will pick up the script when she picks up ritalin script.  She only needs one script for ritalin right now, has appt with you next week so will pick up additional scripts then- she likes to get 3 months at a time.

## 2010-10-13 ENCOUNTER — Other Ambulatory Visit: Payer: Self-pay | Admitting: Family Medicine

## 2010-10-13 DIAGNOSIS — Z0000 Encounter for general adult medical examination without abnormal findings: Secondary | ICD-10-CM

## 2010-10-13 DIAGNOSIS — Z136 Encounter for screening for cardiovascular disorders: Secondary | ICD-10-CM

## 2010-10-13 MED ORDER — ESCITALOPRAM OXALATE 10 MG PO TABS: 10.0000 mg | ORAL_TABLET | Freq: Every day | ORAL | Status: DC

## 2010-10-13 NOTE — Telephone Encounter (Signed)
Patient advised as instructed via telephone, Rx's are ready for pick up will be left at front desk.

## 2010-10-13 NOTE — Telephone Encounter (Signed)
Rx in my box.

## 2010-10-14 ENCOUNTER — Other Ambulatory Visit (INDEPENDENT_AMBULATORY_CARE_PROVIDER_SITE_OTHER): Payer: BC Managed Care – PPO | Admitting: Family Medicine

## 2010-10-14 DIAGNOSIS — Z136 Encounter for screening for cardiovascular disorders: Secondary | ICD-10-CM

## 2010-10-14 DIAGNOSIS — Z0000 Encounter for general adult medical examination without abnormal findings: Secondary | ICD-10-CM

## 2010-10-14 LAB — LIPID PANEL
Cholesterol: 195 mg/dL (ref 0–200)
Total CHOL/HDL Ratio: 5
Triglycerides: 104 mg/dL (ref 0.0–149.0)

## 2010-10-14 LAB — BASIC METABOLIC PANEL
CO2: 24 mEq/L (ref 19–32)
Calcium: 8.8 mg/dL (ref 8.4–10.5)
Creatinine, Ser: 0.8 mg/dL (ref 0.4–1.2)
GFR: 78.44 mL/min (ref >60.00)

## 2010-10-18 ENCOUNTER — Encounter: Payer: Self-pay | Admitting: Family Medicine

## 2010-10-18 LAB — HM MAMMOGRAPHY

## 2010-10-21 ENCOUNTER — Ambulatory Visit (INDEPENDENT_AMBULATORY_CARE_PROVIDER_SITE_OTHER): Payer: BC Managed Care – PPO | Admitting: Family Medicine

## 2010-10-21 ENCOUNTER — Encounter: Payer: Self-pay | Admitting: Family Medicine

## 2010-10-21 VITALS — BP 92/60 | HR 80 | Temp 97.9°F | Ht 64.0 in | Wt 231.5 lb

## 2010-10-21 DIAGNOSIS — F988 Other specified behavioral and emotional disorders with onset usually occurring in childhood and adolescence: Secondary | ICD-10-CM

## 2010-10-21 DIAGNOSIS — Z1231 Encounter for screening mammogram for malignant neoplasm of breast: Secondary | ICD-10-CM

## 2010-10-21 DIAGNOSIS — F329 Major depressive disorder, single episode, unspecified: Secondary | ICD-10-CM

## 2010-10-21 DIAGNOSIS — Z0000 Encounter for general adult medical examination without abnormal findings: Secondary | ICD-10-CM

## 2010-10-21 MED ORDER — OMEPRAZOLE 20 MG PO CPDR: 20.0000 mg | DELAYED_RELEASE_CAPSULE | Freq: Every day | ORAL | Status: DC

## 2010-10-21 MED ORDER — METHYLPHENIDATE HCL ER (LA) 30 MG PO CP24: 30.0000 mg | ORAL_CAPSULE | ORAL | Status: AC

## 2010-10-21 NOTE — Progress Notes (Signed)
45 yo here for CPX.  G2P2, no h/o abnormal pap smears.  Sexually active with husband only. Last pap smear normal 10/2010.   ADHD- symptoms much improved on Ritalin. No CP, SOB or palpitations.   Depression- cut back to Lexapro 10 mg daily instead of 20 mg because felt a little "jittery."  Symptoms much improved.  No anxiety or tearfulness.  Less irritability.   Review of Systems       See HPI Patient reports no  vision/ hearing changes,anorexia, weight change, fever ,adenopathy, persistant / recurrent hoarseness, swallowing issues, chest pain, edema,persistant / recurrent cough, hemoptysis, dyspnea(rest, exertional, paroxysmal nocturnal), gastrointestinal  bleeding (melena, rectal bleeding), abdominal pain, excessive heart burn, GU symptoms(dysuria, hematuria, pyuria, voiding/incontinence  Issues) syncope, focal weakness, severe memory loss, concerning skin lesions, depression, anxiety, abnormal bruising/bleeding, major joint swelling, breast masses or abnormal vaginal bleeding.     Patient Active Problem List  Diagnoses  . DEPRESSION  . UTI  . ADD  . CHOLELITHIASIS, WITH OBSTRUCTION  . RUQ PAIN   Past Medical History  Diagnosis Date  . Depression   . Genital warts   . ADHD (attention deficit hyperactivity disorder)    Past Surgical History  Procedure Date  . Endometrial ablation 2008   History  Substance Use Topics  . Smoking status: Former Games developer  . Smokeless tobacco: Not on file  . Alcohol Use: Yes   Family History  Problem Relation Age of Onset  . Cancer Mother 77    breast   . Cancer Other     breast   Allergies not on file Current Outpatient Prescriptions on File Prior to Visit  Medication Sig Dispense Refill  . cyanocobalamin (,VITAMIN B-12,) 1000 MCG/ML injection One injection every 14 days       . escitalopram (LEXAPRO) 10 MG tablet Take 1 tablet (10 mg total) by mouth daily.  90 tablet  2  . methylphenidate (RITALIN LA) 30 MG 24 hr capsule Take 1 capsule  (30 mg total) by mouth every morning.  30 capsule  0  . omeprazole (PRILOSEC) 20 MG capsule Take 20 mg by mouth daily.         The PMH, PSH, Social History, Family History, Medications, and allergies have been reviewed in [facility], and have been updated if relevant.  Physical Exam BP 92/60  Pulse 80  Temp(Src) 97.9 F (36.6 C) (Oral)  Ht 5\' 4"  (1.626 m)  Wt 231 lb 8 oz (105.008 kg)  BMI 39.74 kg/m2  General:  Well-developed,well-nourished,in no acute distress; alert,appropriate and cooperative throughout examination Head:  normocephalic and atraumatic.   Eyes:  vision grossly intact, pupils equal, pupils round, and pupils reactive to light.   Ears:  R ear normal and L ear normal.   Nose:  no external deformity.   Mouth:  good dentition.   Neck:  No deformities, masses, or tenderness noted. Breasts:  No mass, nodules, thickening, tenderness, bulging, retraction, inflamation, nipple discharge or skin changes noted.   Lungs:  Normal respiratory effort, chest expands symmetrically. Lungs are clear to auscultation, no crackles or wheezes. Heart:  Normal rate and regular rhythm. S1 and S2 normal without gallop, murmur, click, rub or other extra sounds. Abdomen:  Bowel sounds positive,abdomen soft and non-tender without masses, organomegaly or hernias noted. Msk:  No deformity or scoliosis noted of thoracic or lumbar spine.   Extremities:  No clubbing, cyanosis, edema, or deformity noted with normal full range of motion of all joints.   Neurologic:  alert &  oriented X3 and gait normal.   Skin:  Intact without suspicious lesions or rashes Cervical Nodes:  No lymphadenopathy noted Axillary Nodes:  No palpable lymphadenopathy Psych:  Cognition and judgment appear intact. Alert and cooperative with normal attention span and concentration. No apparent delusions, illusions, hallucinations   Assessment and Plan:  1. Routine general medical examination at a health care facility   Reviewed  preventive care protocols, scheduled due services, and updated immunizations Discussed nutrition, exercise, diet, and healthy lifestyle.    2. DEPRESSION  Stable on current dose of lexapro.   3. ADD

## 2010-10-21 NOTE — Patient Instructions (Signed)
Please stop by to see Aram Beecham on your way out. Have a great summer!

## 2010-11-11 ENCOUNTER — Encounter: Payer: Self-pay | Admitting: Family Medicine

## 2010-11-11 ENCOUNTER — Ambulatory Visit (INDEPENDENT_AMBULATORY_CARE_PROVIDER_SITE_OTHER): Payer: BC Managed Care – PPO | Admitting: Family Medicine

## 2010-11-11 VITALS — BP 104/68 | HR 68 | Temp 98.5°F | Wt 231.8 lb

## 2010-11-11 DIAGNOSIS — M545 Low back pain: Secondary | ICD-10-CM

## 2010-11-11 DIAGNOSIS — R109 Unspecified abdominal pain: Secondary | ICD-10-CM

## 2010-11-11 DIAGNOSIS — R1030 Lower abdominal pain, unspecified: Secondary | ICD-10-CM

## 2010-11-11 LAB — POCT URINALYSIS DIPSTICK
Spec Grav, UA: 1.03
Urobilinogen, UA: 0.2

## 2010-11-11 MED ORDER — CIPROFLOXACIN HCL 500 MG PO TABS: 500.0000 mg | ORAL_TABLET | Freq: Two times a day (BID) | ORAL | Status: AC

## 2010-11-11 NOTE — Assessment & Plan Note (Addendum)
Not typical sxs of UTI.  Nor of nephrolithiasis. Check bmp to eval renal fxn. UA with large blood, TNTC RBCs, not many WBC though. Still concern for recurrent infection or incompletely treated. Take 7d course cipro, update [place] if not improving.  If not better, consider renal US Advised to seek care if worsening, or fevers, inability to void or other red flags.

## 2010-11-11 NOTE — Progress Notes (Signed)
  Subjective:    Patient ID: Krystal Mays, female    DOB: 1965/09/25, 45 y.o.   MRN: [id number]  HPI CC: UTI?  Saturday went to urgent care with dysuria, urgency, dx with UTI.  Unsure if they sent culture.  Prescribed septra x 3 days.  sxs slightly improved but never fully better.  Then yesterday started having sxs again - lower abd pain, lower back pain.  Feeling somewhat bloated as well.  Voiding less, not drinking as much as should be.  yest and today worse, abd pain at times cramping and had her doubled in pain.  No dysuria.  Feels like needs to go more often but not voiding as much.  mild nausea.  No shooting pains down into groin.  Never had kidney stones in past.  No blood noted in urine but darker than normal.  S/p endometrial ablation, doesn't have regular periods, not currently on cycle.  No fevers/chills, vomiting, flank pain.  Review of Systems Per HPI    Objective:   Physical Exam  Nursing note and vitals reviewed. Constitutional: She appears well-developed and well-nourished. No distress.  HENT:  Head: Normocephalic and atraumatic.  Mouth/Throat: Oropharynx is clear and moist. No oropharyngeal exudate.  Eyes: Conjunctivae and EOM are normal. Pupils are equal, round, and reactive to light. No scleral icterus.  Neck: Normal range of motion. Neck supple.  Cardiovascular: Normal rate, regular rhythm, normal heart sounds and intact distal pulses.   No murmur heard. Pulmonary/Chest: Effort normal and breath sounds normal. No respiratory distress. She has no wheezes. She has no rales.  Abdominal: Soft. Bowel sounds are normal. She exhibits no distension. There is tenderness (mild) in the suprapubic area. There is no rebound, no guarding and no CVA tenderness.  Musculoskeletal: She exhibits edema (nonpitting).  Skin: Skin is warm and dry. No rash noted.  Psychiatric: She has a normal mood and affect.          Assessment & Plan:

## 2010-11-11 NOTE — Patient Instructions (Signed)
Looks like continued urine infection, however ,I'd like to check other blood work to rule out other problems. Treat with cipro 500mg  twice daily for 7 days, update [place] with how you're feeling. Urine culture sent. Push fluids, plenty of rest. If worsening abdominal pain or fevers, or unable to pass urine, may need to seek urgent evaluation.

## 2010-11-12 LAB — CBC WITH DIFFERENTIAL/PLATELET
Basophils Absolute: 0 10*3/uL (ref 0.0–0.1)
Basophils Relative: 0.5 % (ref 0.0–3.0)
HCT: 38.8 % (ref 36.0–46.0)
Hemoglobin: 13.6 g/dL (ref 12.0–15.0)
Lymphs Abs: 2.1 10*3/uL (ref 0.7–4.0)
MCHC: 35 g/dL (ref 30.0–36.0)
Monocytes Relative: 9.3 % (ref 3.0–12.0)
Neutro Abs: 4.3 10*3/uL (ref 1.4–7.7)
RBC: 4.11 Mil/uL (ref 3.87–5.11)
RDW: 12.8 % (ref 11.5–14.6)

## 2010-11-12 LAB — BASIC METABOLIC PANEL
BUN: 20 mg/dL (ref 6–23)
CO2: 25 mEq/L (ref 19–32)
Calcium: 9.5 mg/dL (ref 8.4–10.5)
Creatinine, Ser: 1 mg/dL (ref 0.4–1.2)

## 2010-11-13 ENCOUNTER — Inpatient Hospital Stay (INDEPENDENT_AMBULATORY_CARE_PROVIDER_SITE_OTHER)
Admission: RE | Admit: 2010-11-13 | Discharge: 2010-11-13 | Disposition: A | Discharge status: Home / Self Care | Payer: BC Managed Care – PPO | Source: Ambulatory Visit | Attending: Family Medicine | Admitting: Family Medicine

## 2010-11-13 DIAGNOSIS — N390 Urinary tract infection, site not specified: Secondary | ICD-10-CM

## 2010-11-13 LAB — POCT URINALYSIS DIP (DEVICE)
Hgb urine dipstick: NEGATIVE
Nitrite: POSITIVE — AB
Urobilinogen, UA: 2 mg/dL — ABNORMAL HIGH (ref 0.0–1.0)
pH: 7 (ref 5.0–8.0)

## 2010-11-13 LAB — URINE CULTURE

## 2010-11-14 LAB — URINE CULTURE

## 2010-11-15 ENCOUNTER — Telehealth: Payer: Self-pay | Admitting: *Deleted

## 2010-11-15 NOTE — Telephone Encounter (Signed)
Left message on machine at home for patient to return call. 

## 2010-11-15 NOTE — Telephone Encounter (Signed)
Her urine was definitely positive in the ER.  I just reviewed it. So she does have a UTI. I would recommend her coming in for ER follow up tomorrow and we can repeat her UA and urine culture to make sure it's sensitive to Keflex since it is apparently resistant to Bactrim.

## 2010-11-15 NOTE — Telephone Encounter (Signed)
Patient advised as instructed via telephone.  Appt scheduled for 11/16/2010 at 11:00.

## 2010-11-15 NOTE — Telephone Encounter (Signed)
Spoke with patient and she stated that she has been on Keflex for for three days and doesn't feel a big difference better.  She still has low back pain, pain in pelvic area, and she gets chills from time to time.  No fever, no burning, no urgency.  She stated that the urine culture Dr. Sharen Hones did was negative.  Please advise.

## 2010-11-15 NOTE — Telephone Encounter (Signed)
[facility] Triage Call Report Triage Record Num: [id number] Operator: Chevis Pretty Patient Name: Krystal Mays Call Date & Time: 11/13/2010 12:25:34PM Patient Phone: [phone] PCP: Patient Gender: Female PCP Fax : Patient DOB: 1965/10/01 Practice Name: [facility] Reason for Call: LMP ~ ablation done, no periods. Patient calling about UTI. Seen in office 11/11/10 and placed on Septra. Has had 5 doses of antibiotic, but symptoms have not improved. States she has not improved in terms of back pain which was present on 11/11/10. Had been on Septra x 3 days 07/21-23/12 and when seen 11/11/10, she had already taken Septra for this. Feels she should feel better by now. Afebrile. Denies emergent symptoms per protocol; advised appt within 24 hours. TC to office; if patient can be at office within 20 min, may be seen; otherwise to UC. Patient not sure if can make office by then; to go to [facility] UC. Protocol(s) Used: Urinary Symptoms - Female Recommended Outcome per Protocol: See Provider within 24 hours Reason for Outcome: Evaluated by provider AND symptoms worsening after following recommended treatment plan for 72 hours Care Advice: ~ Go to the ED IMMEDIATELY if repeated vomiting or severe pain occurs. ~ Call provider if you develop flank or low back pain, fever, generally feel sick. Increase intake of fluids. Try to drink 8 oz. (.2 liter) every hour when awake, including unsweetened cranberry juice, unless on restricted fluids for other medical reasons. Take sips of fluid or eat ice chips if nauseated or vomiting. ~ ~ SYMPTOM / CONDITION MANAGEMENT ~ CAUTIONS Limit carbonated, alcoholic, and caffeinated beverages such as coffee, tea and soda. Avoid nonprescription cold and allergy medications that contain caffeine. Limit intake of tomatoes, fruit juices (except for unsweetened cranberry juice), dairy products, spicy foods, sugar, and artificial sweeteners (aspartame or  saccharine). Stop or decrease smoking. Reducing exposure to bladder irritants may help lessen urgency. ~ Analgesic/Antipyretic Advice - Acetaminophen: Consider acetaminophen as directed on label or by pharmacist/provider for pain or fever PRECAUTIONS: - Use if there is no history of liver disease, alcoholism, or intake of three or more alcohol drinks per day - Only if approved by provider during pregnancy or when breastfeeding - During pregnancy, acetaminophen should not be taken more than 3 consecutive days without telling provider - Do not exceed recommended dose or frequency ~ ~ Call provider if urine is pink, red, smoky or cola colored. Systemic Inflammatory Response Syndrome (SIRS): Watch for signs of a generalized, whole body infection. Occurs within days of a localized infection, especially of the urinary, GI, respiratory or nervous systems; or after a traumatic injury or invasive procedure. - Call EMS 911 if symptoms have worsened, such as increasing confusion or unusual drowsiness; cold and clammy skin; no urine output; rapid respiration (>30/min.) or slow respiration (<10/min.); struggling to breathe. - Go to the ED immediately for early symptoms of rapid pulse >90/min. or rapid breathing >20/min. at rest; chills; oral temperature >100.4 F (38 C) or <96.8 F (36 C) when associated with conditions noted. ~ Analgesic/Antipyretic Advice - NSAIDs: Consider aspirin, ibuprofen, naproxen or ketoprofen for pain or fever as directed on label or by pharmacist/provider. PRECAUTIONS: ~ 11/13/2010 12:38:15PM Page 1 of 2 [id number] [facility] Triage Call Report Patient Name: Krystal Mays continuation page/s - If over 50 years of age, should not take longer than 1 week without consulting provider. EXCEPTIONS: - Should not be used if taking blood thinners or have bleeding problems. - Do not use if have history of sensitivity/allergy to any  of these medications; or history of  cardiovascular, ulcer, kidney, liver disease or diabetes unless approved by provider. - Do not exceed recommended dose or frequency. 11/13/2010 12:38:15PM Page 2 of 2 [email or web address]_[email or web address]_[email or web address]

## 2010-11-15 NOTE — Telephone Encounter (Signed)
Er notes reviewed, changed to Keflex. Please call pt to check up on her.

## 2010-11-16 ENCOUNTER — Encounter: Payer: Self-pay | Admitting: Family Medicine

## 2010-11-16 ENCOUNTER — Ambulatory Visit (INDEPENDENT_AMBULATORY_CARE_PROVIDER_SITE_OTHER): Payer: BC Managed Care – PPO | Admitting: Family Medicine

## 2010-11-16 VITALS — BP 110/70 | HR 73 | Temp 98.8°F | Wt 232.5 lb

## 2010-11-16 DIAGNOSIS — N390 Urinary tract infection, site not specified: Secondary | ICD-10-CM

## 2010-11-16 LAB — POCT URINALYSIS DIPSTICK
Bilirubin, UA: NEGATIVE
Glucose, UA: NEGATIVE
Nitrite, UA: POSITIVE

## 2010-11-16 MED ORDER — CEFTRIAXONE SODIUM 1 G IJ SOLR
1.0000 g | Freq: Once | INTRAMUSCULAR | Status: AC
  Administered 2010-11-16: 1 g via INTRAMUSCULAR

## 2010-11-16 NOTE — Patient Instructions (Signed)
Please stop by to see Shirlee Limerick on your way out. If your symptoms get worse in the next 48 hours, please call me immediately.

## 2010-11-16 NOTE — Progress Notes (Signed)
  Subjective:    Patient ID: Krystal Mays, female    DOB: 03-21-1966, 45 y.o.   MRN: [id number]  HPI 45 yo here for ER follow up.  ON 7/21, went to [facility] in [place] for dysuria, increased urinary frequency.  Placed on 3 day course of cipro.  Saw Dr. Reece Agar on 7/26 for UTI symptoms, UA showed large blood, placed on 7 day course of cipro. Urine culture showed no growth.  Went to [facility] UC for persistent symptoms on 7/28- UA showed large LE, pos nitrites. Placed on Keflex. Symptoms persist, maybe a little better today.  Urine culture pending.    Still having some back pain, dysuria and increased frequency. No fevers, chills, nausea or vomiting.   Review of Systems See HPI    Objective:   Physical Exam BP 110/70  Pulse 73  Temp(Src) 98.8 F (37.1 C) (Oral)  Wt 232 lb 8 oz (105.461 kg)  Constitutional: She appears well-developed and well-nourished. No distress.  HENT:  Head: Normocephalic and atraumatic.  Mouth/Throat: Oropharynx is clear and moist. No oropharyngeal exudate.  Eyes: Conjunctivae and EOM are normal. Pupils are equal, round, and reactive to light. No scleral icterus.  Neck: Normal range of motion. Neck supple.  Cardiovascular: Normal rate, regular rhythm, normal heart sounds and intact distal pulses.  No murmur heard.  Pulmonary/Chest: Effort normal and breath sounds normal. No respiratory distress. She has no wheezes. She has no rales.  Abdominal: Soft. Bowel sounds are normal. She exhibits no distension. There is tenderness (mild) in the suprapubic area. There is no rebound, no guarding and no CVA tenderness.  Skin: Skin is warm and dry. No rash noted.  Psychiatric: She has a normal mood and affect.      Assessment & Plan:   1. UTI   UA positive today for nitrites, large blood, large LE. CTX x 1 IM today. I am hesistant to d/c keflex after only a couple of days as we will develop a more resistant strain that is already clearly resistant to cipro. Will also send  urine for C and S today. Refer to urology to further work up- rule out other pathology such as nephrolithiasis. The patient indicates understanding of these issues and agrees with the plan.

## 2010-11-16 NOTE — Progress Notes (Signed)
Addended by: Gilmer Mor on: 11/16/2010 11:40 AM   Modules accepted: Orders

## 2010-11-17 ENCOUNTER — Ambulatory Visit: Payer: BC Managed Care – PPO | Admitting: Family Medicine

## 2010-11-18 LAB — URINE CULTURE: Colony Count: 30000

## 2010-11-29 ENCOUNTER — Other Ambulatory Visit: Payer: Self-pay | Admitting: *Deleted

## 2010-11-29 MED ORDER — ESCITALOPRAM OXALATE 20 MG PO TABS: 20.0000 mg | ORAL_TABLET | Freq: Every day | ORAL | Status: DC

## 2010-11-29 NOTE — Telephone Encounter (Signed)
Pt has been taking two 10 mg lexapro's but would like a new script for 20 mg's [facility] to [facility], so that she will only have to take one pill.

## 2010-11-30 ENCOUNTER — Ambulatory Visit: Payer: Self-pay | Admitting: Family Medicine

## 2010-12-01 ENCOUNTER — Telehealth: Payer: Self-pay | Admitting: *Deleted

## 2010-12-01 NOTE — Telephone Encounter (Signed)
I cannot give recommendations without seeing her first.

## 2010-12-01 NOTE — Telephone Encounter (Signed)
Patient saw Urology and was sent for some x-rays of her urinary tract.  She is now having some bleeding from the rectum and was told to notify her PCP.  The patient thinks that the bleeding is likely from hemorrhoids and not in her stool.

## 2010-12-02 ENCOUNTER — Telehealth: Payer: Self-pay | Admitting: *Deleted

## 2010-12-02 DIAGNOSIS — N852 Hypertrophy of uterus: Secondary | ICD-10-CM

## 2010-12-02 NOTE — Telephone Encounter (Signed)
Patient is asking if you could call her regarding her appt that she had with the urologist.

## 2010-12-02 NOTE — Telephone Encounter (Signed)
Called pt. Went to urology today. Per pt, urologist wants cystoscopy and recommended pelvic ultrasound to evaluate uterus. I will place order.

## 2010-12-03 ENCOUNTER — Ambulatory Visit: Payer: Self-pay | Admitting: Family Medicine

## 2010-12-03 NOTE — Telephone Encounter (Signed)
Patient says that the rectal bleeding has resolved itself.  She is pretty sure it was hemorrhoids as she stated on the initial call.  She is still having the urinary problems and her Urologist is keeping her on ABX and is going to scope her on Monday.

## 2010-12-06 ENCOUNTER — Ambulatory Visit: Payer: Self-pay | Admitting: Urology

## 2010-12-06 ENCOUNTER — Encounter: Payer: Self-pay | Admitting: Family Medicine

## 2010-12-09 ENCOUNTER — Encounter: Payer: Self-pay | Admitting: Family Medicine

## 2010-12-13 ENCOUNTER — Encounter: Payer: Self-pay | Admitting: Family Medicine

## 2010-12-16 ENCOUNTER — Ambulatory Visit: Payer: Self-pay | Admitting: Family Medicine

## 2010-12-17 ENCOUNTER — Telehealth: Payer: Self-pay | Admitting: Family Medicine

## 2010-12-17 ENCOUNTER — Encounter: Payer: Self-pay | Admitting: Family Medicine

## 2010-12-17 NOTE — Telephone Encounter (Signed)
Dr. Elmer Sow patient.  Please notify mammogram/ultrasound returned showing benign simple cyst, no further eval needed.   rec rpt mammo per previously recommended routine. Please input into chart as birads2 with rec rpt 1 year.

## 2010-12-21 NOTE — Telephone Encounter (Signed)
Message left notifying patient that results showed a benign cyst and to follow the recommended routine for follow up mammogram. Instructed to call with any questions or concerns. Chart updated.

## 2011-02-01 ENCOUNTER — Encounter: Payer: Self-pay | Admitting: Family Medicine

## 2011-02-01 ENCOUNTER — Ambulatory Visit (INDEPENDENT_AMBULATORY_CARE_PROVIDER_SITE_OTHER): Payer: BC Managed Care – PPO | Admitting: Family Medicine

## 2011-02-01 VITALS — BP 110/70 | HR 66 | Temp 98.6°F | Ht 64.0 in | Wt 221.8 lb

## 2011-02-01 DIAGNOSIS — F3289 Other specified depressive episodes: Secondary | ICD-10-CM

## 2011-02-01 DIAGNOSIS — F988 Other specified behavioral and emotional disorders with onset usually occurring in childhood and adolescence: Secondary | ICD-10-CM

## 2011-02-01 DIAGNOSIS — F329 Major depressive disorder, single episode, unspecified: Secondary | ICD-10-CM

## 2011-02-01 MED ORDER — DULOXETINE HCL 30 MG PO CPEP: 30.0000 mg | ORAL_CAPSULE | Freq: Every day | ORAL | Status: DC

## 2011-02-01 NOTE — Patient Instructions (Signed)
Good to see you, Shaeley. I want you to start take Lexapro 10 mg every other day for 2 week and stop. Start taking Cymbalta 30 mg daily. Call me in a few weeks with update.

## 2011-02-01 NOTE — Progress Notes (Signed)
45 yo here to discuss depression and anxiety.     Depression- currently taking Lexapro 20 mg daily. 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   Constantly anxious and tearful. Very "short tempered." No SI or HI. Feels on edge all the time. Concentration is better with Ritalin.  Does not want to go out with friends. Feels "achy all over."  Recently diagnosed with IC- Elmiron helping a little with those symptoms.   Review of Systems       See HPI Patient reports no  vision/ hearing changes,anorexia, weight change, fever ,adenopathy, persistant / recurrent hoarseness, swallowing issues, chest pain, edema,persistant / recurrent cough, hemoptysis, dyspnea(rest, exertional, paroxysmal nocturnal), gastrointestinal  bleeding (melena, rectal bleeding), abdominal pain, excessive heart burn, GU symptoms(dysuria, hematuria, pyuria, voiding/incontinence  Issues) syncope, focal weakness, severe memory loss, concerning skin lesions, depression, anxiety, abnormal bruising/bleeding, major joint swelling, breast masses or abnormal vaginal bleeding.     Patient Active Problem List  Diagnoses  . DEPRESSION  . UTI  . ADD  . CHOLELITHIASIS, WITH OBSTRUCTION  . RUQ PAIN  . Routine general medical examination at a health care facility  . Abdominal pain, lower   Past Medical History  Diagnosis Date  . Depression   . Genital warts   . ADHD (attention deficit hyperactivity disorder)    Past Surgical History  Procedure Date  . Endometrial ablation 2008   History  Substance Use Topics  . Smoking status: Former Games developer  . Smokeless tobacco: Not on file  . Alcohol Use: Yes   Family History  Problem Relation Age of Onset  . Cancer Mother 22    breast   . Cancer Other     breast   No Known Allergies Current Outpatient Prescriptions on File Prior to Visit  Medication Sig Dispense Refill  . cyanocobalamin (,VITAMIN B-12,) 1000 MCG/ML injection One injection every  14 days       . omeprazole (PRILOSEC) 20 MG capsule Take 1 capsule (20 mg total) by mouth daily.  30 capsule  6   The PMH, PSH, Social History, Family History, Medications, and allergies have been reviewed in [facility], and have been updated if relevant.  Physical Exam BP 110/70  Pulse 66  Temp(Src) 98.6 F (37 C) (Oral)  Ht 5\' 4"  (1.626 m)  Wt 221 lb 12 oz (100.585 kg)  BMI 38.06 kg/m2  General:  Well-developed,well-nourished,in no acute distress; alert,appropriate and cooperative throughout examination Head:  normocephalic and atraumatic.   Eyes:  vision grossly intact, pupils equal, pupils round, and pupils reactive to light.   Ears:  R ear normal and L ear normal.   Psych:  Cognition and judgment appear intact. Alert and cooperative with normal attention span and concentration. No apparent delusions, illusions, hallucinations   Assessment and Plan:  1. DEPRESSION  >25 min spent with face to face with patient, >50% counseling and/or coordinating care. Strongly urged her to resume psychotherapy.  She will talk to her employer about seeing therapy at work since it is paid for. Discussed different tx options, she would like to try cymbalta. See pt instructions for lexapro wean.     2. ADD   Stable.

## 2011-02-09 ENCOUNTER — Telehealth: Payer: Self-pay | Admitting: *Deleted

## 2011-02-09 NOTE — Telephone Encounter (Signed)
Pt is asking for anucort [facility] for hemorrhoids.  States she has gotten this before from a previous doctor.  Uses [facility].

## 2011-02-10 MED ORDER — HYDROCORTISONE ACETATE 25 MG RE SUPP: 25.0000 mg | Freq: Two times a day (BID) | RECTAL | Status: AC

## 2011-02-10 NOTE — Telephone Encounter (Signed)
rx sent

## 2011-02-15 ENCOUNTER — Telehealth: Payer: Self-pay | Admitting: *Deleted

## 2011-02-15 NOTE — Telephone Encounter (Signed)
Letter written, patient advised as instructed via telephone.  Left at front desk for pick up.

## 2011-02-15 NOTE — Telephone Encounter (Signed)
Pt is asking that you call her to discuss her going out on medical leave.  She also wants to discuss lexapro.  She states you had told her to follow up, but told her she could do that with a phone call.

## 2011-02-15 NOTE — Telephone Encounter (Signed)
Returned patient's phone call on cell. She is seeing a therapist. She feels that she needs to take time off of work because she is under stress. Needs note that she would be for 10 days-02/23/2011-03/14/2011. Please write note stating that she can be out those dates.  I will sign.

## 2011-02-22 ENCOUNTER — Other Ambulatory Visit: Payer: Self-pay | Admitting: *Deleted

## 2011-02-22 MED ORDER — DULOXETINE HCL 60 MG PO CPEP: 60.0000 mg | ORAL_CAPSULE | Freq: Every day | ORAL | Status: DC

## 2011-02-22 MED ORDER — METHYLPHENIDATE HCL ER (LA) 30 MG PO CP24: 30.0000 mg | ORAL_CAPSULE | ORAL | Status: DC

## 2011-02-22 NOTE — Telephone Encounter (Signed)
Pt called to report that cymbalta is working well for her and she would like a new script sent to [facility].  She says she is taking 60 mg's, one a day.  Chart has 30 mg's, 2 a day.

## 2011-02-22 NOTE — Telephone Encounter (Signed)
Yes ok to change to 60 mg daily and send in rx to [facility]

## 2011-02-22 NOTE — Telephone Encounter (Signed)
Rx sent to [facility] for Cymbalta 60mg , #30 with 1 refill.  Patient advised as instructed via telephone.  Patient also request Rx refill for Ritalin to pick up tomorrow.  Rx printed and put in your IN box for signature.

## 2011-02-23 NOTE — Telephone Encounter (Signed)
Rx put at front desk for patient pick up. 

## 2011-03-04 ENCOUNTER — Encounter: Payer: Self-pay | Admitting: Family Medicine

## 2011-03-04 ENCOUNTER — Ambulatory Visit (INDEPENDENT_AMBULATORY_CARE_PROVIDER_SITE_OTHER): Payer: BC Managed Care – PPO | Admitting: Family Medicine

## 2011-03-04 ENCOUNTER — Encounter: Payer: Self-pay | Admitting: Internal Medicine

## 2011-03-04 VITALS — BP 110/70 | HR 78 | Temp 98.1°F | Ht 64.0 in | Wt 217.0 lb

## 2011-03-04 DIAGNOSIS — K219 Gastro-esophageal reflux disease without esophagitis: Secondary | ICD-10-CM

## 2011-03-04 MED ORDER — OMEPRAZOLE 40 MG PO CPDR: DELAYED_RELEASE_CAPSULE | ORAL | Status: DC

## 2011-03-04 NOTE — Patient Instructions (Signed)
Great to see you. Please stop by to see Shirlee Limerick on your way out to set up your GI appointment.

## 2011-03-04 NOTE — Progress Notes (Signed)
45 yo here for stomach pain.  Work has been very stressful. Has a "gnawing pain" in her epigastric area that is worse late at night or on an empty stomach. Prilosec helps a little bit but she ran out of it for several weeks. Has a bad taste in her mouth in the morning. Feels like her "tongue is burning."  No black stools although she thinks that they have been looser.  No vomiting but at times nauseated.  Recently diagnosed with IC- Elmiron helping a little with those symptoms.   Review of Systems       See HPI Patient reports no  vision/ hearing changes,anorexia, weight change, fever ,adenopathy, persistant / recurrent hoarseness, swallowing issues, chest pain, edema,persistant / recurrent cough, hemoptysis, dyspnea(rest, exertional, paroxysmal nocturnal), gastrointestinal  bleeding (melena, rectal bleeding), abdominal pain, excessive heart burn, GU symptoms(dysuria, hematuria, pyuria, voiding/incontinence  Issues) syncope, focal weakness, severe memory loss, concerning skin lesions, depression, anxiety, abnormal bruising/bleeding, major joint swelling, breast masses or abnormal vaginal bleeding.     Patient Active Problem List  Diagnoses  . DEPRESSION  . UTI  . ADD  . CHOLELITHIASIS, WITH OBSTRUCTION  . RUQ PAIN  . Routine general medical examination at a health care facility  . Abdominal pain, lower   Past Medical History  Diagnosis Date  . Depression   . Genital warts   . ADHD (attention deficit hyperactivity disorder)    Past Surgical History  Procedure Date  . Endometrial ablation 2008   History  Substance Use Topics  . Smoking status: Former Games developer  . Smokeless tobacco: Not on file  . Alcohol Use: Yes   Family History  Problem Relation Age of Onset  . Cancer Mother 49    breast   . Cancer Other     breast   No Known Allergies Current Outpatient Prescriptions on File Prior to Visit  Medication Sig Dispense Refill  . cyanocobalamin (,VITAMIN B-12,) 1000  MCG/ML injection One injection every 14 days       . DULoxetine (CYMBALTA) 60 MG capsule Take 1 capsule (60 mg total) by mouth daily.  30 capsule  1  . hydrocortisone (ANUSOL-HC) 25 MG suppository Place 1 suppository (25 mg total) rectally every 12 (twelve) hours.  12 suppository  1  . Meth-Hyo-M Bl-Na Phos-Ph Sal (URIBEL PO) Take 1 tablet by mouth 4 (four) times daily.        . methylphenidate (RITALIN LA) 30 MG 24 hr capsule Take 1 capsule (30 mg total) by mouth every morning.  30 capsule  0  . omeprazole (PRILOSEC) 20 MG capsule Take 1 capsule (20 mg total) by mouth daily.  30 capsule  6  . pentosan polysulfate (ELMIRON) 100 MG capsule Take 100 mg by mouth 3 (three) times daily.         The PMH, PSH, Social History, Family History, Medications, and allergies have been reviewed in [facility], and have been updated if relevant.  Physical Exam BP 110/70  Pulse 78  Temp(Src) 98.1 F (36.7 C) (Oral)  Ht 5\' 4"  (1.626 m)  Wt 217 lb (98.431 kg)  BMI 37.25 kg/m2 Constitutional: She appears well-developed and well-nourished. No distress.  HENT:  Head: Normocephalic and atraumatic.  Mouth/Throat: Oropharynx is clear and moist. No oropharyngeal exudate.  Eyes: Conjunctivae and EOM are normal. Pupils are equal, round, and reactive to light. No scleral icterus.  Neck: Normal range of motion. Neck supple.  Cardiovascular: Normal rate, regular rhythm, normal heart sounds and intact distal pulses.  No murmur heard.  Pulmonary/Chest: Effort normal and breath sounds normal. No respiratory distress. She has no wheezes. She has no rales.  Abdominal: Soft. Bowel sounds are normal. She exhibits no distension.There is no rebound, no guarding and no CVA tenderness.  Skin: Skin is warm and dry. No rash noted.  Psychiatric: She has a normal mood and affect.   Assessment and Plan:  1. GERD (gastroesophageal reflux disease)  Ambulatory referral to Gastroenterology   Symptoms consistent with GERD vs PUD. Will  place on 2 week course of Omeprazole 40 mg twice daily and refer to GI for possible endoscopy/further work up. The patient indicates understanding of these issues and agrees with the plan.

## 2011-03-15 ENCOUNTER — Encounter: Payer: Self-pay | Admitting: Internal Medicine

## 2011-03-17 ENCOUNTER — Ambulatory Visit: Payer: BC Managed Care – PPO | Admitting: Internal Medicine

## 2011-03-25 ENCOUNTER — Other Ambulatory Visit: Payer: Self-pay | Admitting: *Deleted

## 2011-03-25 NOTE — Telephone Encounter (Signed)
Left message on machine at home and on cell phone voicemail for patient to return call.

## 2011-03-28 MED ORDER — OMEPRAZOLE 40 MG PO CPDR: 40.0000 mg | DELAYED_RELEASE_CAPSULE | Freq: Every day | ORAL | Status: DC

## 2011-03-28 NOTE — Telephone Encounter (Signed)
Spoke with patient and she stated that she takes one tablet by mouth daily.  Rx sent electronically.

## 2011-04-14 ENCOUNTER — Telehealth: Payer: Self-pay | Admitting: Internal Medicine

## 2011-04-14 NOTE — Telephone Encounter (Signed)
Spoke with patient and she stated that she will call Psychiatrist office to request her records which she has already signed a release for.  She also wanted to tell Dr. Dayton Martes that she was right about suggesting that patient needed Adderall more than a year ago.

## 2011-04-14 NOTE — Telephone Encounter (Signed)
I would need notes from psychiatrist first.

## 2011-04-14 NOTE — Telephone Encounter (Signed)
Patient called and stated she went to her psychiatrist Dr. Evelene Croon and put her on Adderall 30mg  bid.  Patient stated she didn't want to go back to the psychiatrist and wanted to know if you could take over and prescribe her the medication.

## 2011-04-20 ENCOUNTER — Other Ambulatory Visit: Payer: Self-pay | Admitting: *Deleted

## 2011-04-20 MED ORDER — METHYLPHENIDATE HCL ER (LA) 30 MG PO CP24: ORAL_CAPSULE | ORAL | Status: DC

## 2011-04-20 NOTE — Telephone Encounter (Signed)
Put in your IN box for signature.

## 2011-04-20 NOTE — Telephone Encounter (Signed)
I called patient to let her know that Dr. Dayton Martes has received her records for Dr. Evelene Croon, Psychiatrist and is ok prescribing Adderall.  Patient stated that she does need her Adderall Rx refilled.  Please advise.

## 2011-04-21 NOTE — Telephone Encounter (Signed)
Patient advised via message left on cell phone voicemail, Rx ready for pick up will be left at front desk.

## 2011-04-22 ENCOUNTER — Telehealth: Payer: Self-pay | Admitting: Family Medicine

## 2011-04-22 MED ORDER — AMPHETAMINE-DEXTROAMPHET ER 30 MG PO CP24: ORAL_CAPSULE | ORAL | Status: DC

## 2011-04-22 NOTE — Telephone Encounter (Signed)
Called to check on prescription because it was incorrect when she picked it up.  It should have been for Aderol and it was filled for Ritalin. She needs Aderol. Please call back to assist with refill at [phone]

## 2011-04-22 NOTE — Telephone Encounter (Signed)
Yes ok to change to adderall. Please print out new rx and I will sign.

## 2011-04-22 NOTE — Telephone Encounter (Signed)
Patient advised via telephone that Rx for Adderall is ready for pick up and will be left at front desk.

## 2011-04-22 NOTE — Telephone Encounter (Signed)
Rx in your IN box for signature. 

## 2011-04-22 NOTE — Telephone Encounter (Signed)
Patient stated that her psychiatry suggested Aderrol instead of Ritalin and when she her husband picked the Rx up it was wrong it was for Ritalin instead of Aderrol and he took it to be filled.  She wanted to know if you could print a new Rx for Aderrol and what should she do with the Ritalin she has.  Please advise.

## 2011-05-13 ENCOUNTER — Telehealth: Payer: Self-pay | Admitting: Internal Medicine

## 2011-05-13 NOTE — Telephone Encounter (Signed)
Message copied by Arna Snipe on Fri May 13, 2011  8:46 AM ------      Message from: Adonis Housekeeper A      Created: Thu Mar 17, 2011  9:19 AM       Per Dr. Rhea Belton, he would like to charge this pt for no showing this morning.             Thank you!

## 2011-05-26 ENCOUNTER — Other Ambulatory Visit: Payer: Self-pay | Admitting: Family Medicine

## 2011-05-26 NOTE — Telephone Encounter (Signed)
Left message on cell phone voicemail for patient to return call. 

## 2011-05-27 MED ORDER — AMPHETAMINE-DEXTROAMPHET ER 30 MG PO CP24: ORAL_CAPSULE | ORAL | Status: DC

## 2011-05-27 MED ORDER — CYANOCOBALAMIN 1000 MCG/ML IJ SOLN: 1000.0000 ug | INTRAMUSCULAR | Status: DC

## 2011-05-27 NOTE — Telephone Encounter (Signed)
Rx left up front desk for patient to pick up.

## 2011-05-27 NOTE — Telephone Encounter (Signed)
Left message on cell phone voicemail for patient to return call. 

## 2011-06-27 ENCOUNTER — Other Ambulatory Visit: Payer: Self-pay | Admitting: *Deleted

## 2011-06-27 MED ORDER — AMPHETAMINE-DEXTROAMPHET ER 30 MG PO CP24: ORAL_CAPSULE | ORAL | Status: DC

## 2011-06-27 NOTE — Telephone Encounter (Signed)
Patient is requesting Rx for Adderall.  She would like to get three Rx's at once instead of one at a time so she will not have to come back every month.  Please advise.

## 2011-06-27 NOTE — Telephone Encounter (Signed)
Ok to print out 3 rx and place in my box for signature. Thanks.

## 2011-06-27 NOTE — Telephone Encounter (Signed)
Left message advising pt that scripts are ready for pick up, scripts left at front desk.

## 2011-08-06 ENCOUNTER — Ambulatory Visit: Payer: BC Managed Care – PPO | Admitting: Family Medicine

## 2011-08-08 ENCOUNTER — Telehealth: Payer: Self-pay | Admitting: Family Medicine

## 2011-08-08 NOTE — Telephone Encounter (Signed)
Triage Record Num: [id number] Operator: Albertine Grates Patient Name: Krystal Mays Call Date & Time: 08/05/2011 5:52:44PM Patient Phone: [phone] PCP: Ruthe Mannan Patient Gender: Female PCP Fax : [phone] Patient DOB: 01-22-1966 Practice Name: [facility] Reason for Call: Caller: Ricca/Patient; PCP: Ruthe Mannan [facility]); CB#: [phone]; ; ; Call regarding Urinary Pain/Bleeding; Has "crampy" feeling in lower abdomen since 4-19. Thinks has "bladder" infection. No painful, frequent urination. Afebrile. Has taken Urobel that has had since "last year" but has not helped. appointment scheduled for 4-20 at 1015 at [facility] office. Protocol(s) Used: Urinary Symptoms - Female Recommended Outcome per Protocol: See Provider within 24 hours Reason for Outcome: Has one or more urinary tract symptoms AND has not been previously evaluated Care Advice: Increase intake of fluids. Try to drink 8 oz. (.2 liter) every hour when awake, including unsweetened cranberry juice, unless on restricted fluids for other medical reasons. Take sips of fluid or eat ice chips if nauseated or vomiting. ~ 08/05/2011 6:00:44PM Page 1 of 1 [email or web address]

## 2011-08-08 NOTE — Telephone Encounter (Signed)
Left message on machine asking pt to call back. 

## 2011-08-08 NOTE — Telephone Encounter (Signed)
Please call to check on pt. 

## 2011-08-09 ENCOUNTER — Other Ambulatory Visit: Payer: Self-pay | Admitting: *Deleted

## 2011-08-09 MED ORDER — AMPHETAMINE-DEXTROAMPHET ER 30 MG PO CP24: ORAL_CAPSULE | ORAL | Status: DC

## 2011-08-09 NOTE — Telephone Encounter (Signed)
Pt needs refill on adderall, ok to wait till Wednesday.  Printed script is on your desk for signature.  Also, pt asks if you will see her 45 year old daughter as a new patient.  She wants to get 2nd opinion on thyroid tests.

## 2011-08-09 NOTE — Telephone Encounter (Signed)
Yes I can see her as a new pt.

## 2011-08-09 NOTE — Telephone Encounter (Signed)
Spoke with pt, she did go to urgent care and is having a flare up of interstitial cystitis.  Otherwise she is ok.

## 2011-08-10 ENCOUNTER — Telehealth: Payer: Self-pay | Admitting: Family Medicine

## 2011-08-10 NOTE — Telephone Encounter (Signed)
Left message advising pt that her script is ready for pick up and also ok to call to schedule new patient appt for her daughter.

## 2011-08-10 NOTE — Telephone Encounter (Signed)
Yes of course.  You can add her on in a 30 minute appt slot sooner. Thank you.

## 2011-08-10 NOTE — Telephone Encounter (Signed)
Pt is wanting to know if her daughter could be a new pt of yours. Your next appointment is in July and she can't wait that long.  She was wondering if she could be put in sooner than July.

## 2011-09-13 ENCOUNTER — Other Ambulatory Visit: Payer: Self-pay

## 2011-09-13 MED ORDER — AMPHETAMINE-DEXTROAMPHET ER 30 MG PO CP24: ORAL_CAPSULE | ORAL | Status: DC

## 2011-09-13 NOTE — Telephone Encounter (Signed)
Pt request rx Adderall XR. Call when ready for pick up;may leave detailed message.

## 2011-09-13 NOTE — Telephone Encounter (Signed)
Left message on voice mail advising pt script is ready for pick up.  Script placed at front desk. 

## 2011-11-08 ENCOUNTER — Other Ambulatory Visit: Payer: Self-pay

## 2011-11-08 MED ORDER — DULOXETINE HCL 60 MG PO CPEP: 60.0000 mg | ORAL_CAPSULE | Freq: Every day | ORAL | Status: DC

## 2011-11-08 NOTE — Telephone Encounter (Signed)
Advised patient, she said she will call back for appt.

## 2011-11-08 NOTE — Telephone Encounter (Signed)
Pt left v/m requesting refill Cymbalta to [facility]. Pt also wants to discuss Adderall with Dr Dayton Martes. Does pt need to schedule appt to discuss Adderall. Last CPX 10/21/10. I left v/m for pt to call back to get more details about Adderall.Please advise.

## 2011-11-08 NOTE — Telephone Encounter (Signed)
Left message on voice mail asking pt to call back. 

## 2011-11-08 NOTE — Telephone Encounter (Signed)
Yes needs an appt

## 2011-12-09 ENCOUNTER — Other Ambulatory Visit: Payer: Self-pay

## 2011-12-09 MED ORDER — AMPHETAMINE-DEXTROAMPHET ER 30 MG PO CP24: ORAL_CAPSULE | ORAL | Status: DC

## 2011-12-09 NOTE — Telephone Encounter (Signed)
Left message advising pt that script will be ready for pick up on Monday.  Script printed and in your in box.

## 2011-12-09 NOTE — Telephone Encounter (Signed)
Pt said has been awhile since took Adderall; pt wants rx for 30 day prescription and then will make appt to discuss with Dr Dayton Martes.Please advise.

## 2011-12-09 NOTE — Telephone Encounter (Signed)
Please advise that I'm out of the office until Monday. It will be ok for her to have one month supply

## 2011-12-12 NOTE — Telephone Encounter (Signed)
Script placed up front for pick up

## 2011-12-15 ENCOUNTER — Telehealth: Payer: Self-pay | Admitting: *Deleted

## 2011-12-15 NOTE — Telephone Encounter (Signed)
Prior auth needed for twice a day dosing on adderall, form is on your desk.

## 2011-12-15 NOTE — Telephone Encounter (Signed)
Form faxed

## 2011-12-15 NOTE — Telephone Encounter (Signed)
Form signed.

## 2012-01-19 ENCOUNTER — Other Ambulatory Visit: Payer: Self-pay | Admitting: Family Medicine

## 2012-01-19 DIAGNOSIS — Z136 Encounter for screening for cardiovascular disorders: Secondary | ICD-10-CM

## 2012-01-19 DIAGNOSIS — E538 Deficiency of other specified B group vitamins: Secondary | ICD-10-CM

## 2012-01-19 DIAGNOSIS — Z0000 Encounter for general adult medical examination without abnormal findings: Secondary | ICD-10-CM

## 2012-01-24 ENCOUNTER — Other Ambulatory Visit: Payer: BC Managed Care – PPO

## 2012-01-26 ENCOUNTER — Other Ambulatory Visit (INDEPENDENT_AMBULATORY_CARE_PROVIDER_SITE_OTHER): Payer: BC Managed Care – PPO

## 2012-01-26 DIAGNOSIS — Z0000 Encounter for general adult medical examination without abnormal findings: Secondary | ICD-10-CM

## 2012-01-26 DIAGNOSIS — E538 Deficiency of other specified B group vitamins: Secondary | ICD-10-CM

## 2012-01-26 DIAGNOSIS — Z136 Encounter for screening for cardiovascular disorders: Secondary | ICD-10-CM

## 2012-01-26 LAB — LIPID PANEL
HDL: 37.8 mg/dL — ABNORMAL LOW (ref >39.00)
VLDL: 29.8 mg/dL (ref 0.0–40.0)

## 2012-01-26 LAB — COMPREHENSIVE METABOLIC PANEL
ALT: 14 U/L (ref 0–35)
Alkaline Phosphatase: 50 U/L (ref 39–117)
CO2: 26 mEq/L (ref 19–32)
Creatinine, Ser: 1 mg/dL (ref 0.4–1.2)
GFR: 67.65 mL/min (ref >60.00)
Total Bilirubin: 0.9 mg/dL (ref 0.3–1.2)

## 2012-01-26 LAB — VITAMIN B12: Vitamin B-12: 807 pg/mL (ref 211–911)

## 2012-01-31 ENCOUNTER — Encounter: Payer: Self-pay | Admitting: Family Medicine

## 2012-01-31 ENCOUNTER — Other Ambulatory Visit (HOSPITAL_COMMUNITY)
Admission: RE | Admit: 2012-01-31 | Discharge: 2012-01-31 | Disposition: A | Discharge status: Home / Self Care | Payer: BC Managed Care – PPO | Source: Ambulatory Visit | Attending: Family Medicine | Admitting: Family Medicine

## 2012-01-31 ENCOUNTER — Ambulatory Visit (INDEPENDENT_AMBULATORY_CARE_PROVIDER_SITE_OTHER): Payer: BC Managed Care – PPO | Admitting: Family Medicine

## 2012-01-31 VITALS — BP 122/80 | HR 64 | Temp 98.3°F | Ht 65.75 in | Wt 215.0 lb

## 2012-01-31 DIAGNOSIS — Z01419 Encounter for gynecological examination (general) (routine) without abnormal findings: Secondary | ICD-10-CM | POA: Insufficient documentation

## 2012-01-31 DIAGNOSIS — Z0000 Encounter for general adult medical examination without abnormal findings: Secondary | ICD-10-CM

## 2012-01-31 DIAGNOSIS — Z23 Encounter for immunization: Secondary | ICD-10-CM

## 2012-01-31 DIAGNOSIS — F988 Other specified behavioral and emotional disorders with onset usually occurring in childhood and adolescence: Secondary | ICD-10-CM

## 2012-01-31 DIAGNOSIS — F329 Major depressive disorder, single episode, unspecified: Secondary | ICD-10-CM

## 2012-01-31 DIAGNOSIS — Z1151 Encounter for screening for human papillomavirus (HPV): Secondary | ICD-10-CM | POA: Insufficient documentation

## 2012-01-31 NOTE — Progress Notes (Signed)
46 yo G2P2 here for CPX/Pap smear.   No h/o abnormal pap smears. Due for mammogram- she will set up.  ADD- doing very well. Not taking any stimulants now.  Doing better since she quit her job.   IC- Elmiron helping a little with those symptoms.  Depression- improved with Cymbalta and with quitting her job.  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  Lab Results  Component Value Date   CHOL 177 01/26/2012   HDL 37.80* 01/26/2012   LDLCALC 109* 01/26/2012   TRIG 149.0 01/26/2012   CHOLHDL 5 01/26/2012       Review of Systems       See HPI Patient reports no  vision/ hearing changes,anorexia, weight change, fever ,adenopathy, persistant / recurrent hoarseness, swallowing issues, chest pain, edema,persistant / recurrent cough, hemoptysis, dyspnea(rest, exertional, paroxysmal nocturnal), gastrointestinal  bleeding (melena, rectal bleeding), abdominal pain, excessive heart burn, GU symptoms(dysuria, hematuria, pyuria, voiding/incontinence  Issues) syncope, focal weakness, severe memory loss, concerning skin lesions, depression, anxiety, abnormal bruising/bleeding, major joint swelling, breast masses or abnormal vaginal bleeding.     Patient Active Problem List  Diagnosis  . DEPRESSION  . UTI  . ADD  . CHOLELITHIASIS, WITH OBSTRUCTION  . RUQ PAIN  . Routine general medical examination at a health care facility  . Abdominal pain, lower  . GERD (gastroesophageal reflux disease)   Past Medical History  Diagnosis Date  . Depression   . Genital warts   . ADHD (attention deficit hyperactivity disorder)    Past Surgical History  Procedure Date  . Endometrial ablation 2008   History  Substance Use Topics  . Smoking status: Former Games developer  . Smokeless tobacco: Not on file  . Alcohol Use: Yes   Family History  Problem Relation Age of Onset  . Cancer Mother 8    breast   . Cancer Other     breast   No Known Allergies Current Outpatient  Prescriptions on File Prior to Visit  Medication Sig Dispense Refill  . amphetamine-dextroamphetamine (ADDERALL XR) 30 MG 24 hr capsule Take one tablet by mouth in the morning and one tablet at noon  60 capsule  0  . cyanocobalamin (,VITAMIN B-12,) 1000 MCG/ML injection Inject 1 mL (1,000 mcg total) into the muscle every 14 (fourteen) days. One injection every 14 days  30 mL  12  . DULoxetine (CYMBALTA) 60 MG capsule Take 1 capsule (60 mg total) by mouth daily.  30 capsule  1  . hydrocortisone (ANUSOL-HC) 25 MG suppository Place 1 suppository (25 mg total) rectally every 12 (twelve) hours.  12 suppository  1  . Meth-Hyo-M Bl-Na Phos-Ph Sal (URIBEL PO) Take 1 tablet by mouth 4 (four) times daily.        Marland Kitchen omeprazole (PRILOSEC) 40 MG capsule Take 1 capsule (40 mg total) by mouth daily.  30 capsule  6  . pentosan polysulfate (ELMIRON) 100 MG capsule Take 100 mg by mouth 3 (three) times daily.         The PMH, PSH, Social History, Family History, Medications, and allergies have been reviewed in [facility], and have been updated if relevant.  Physical Exam BP 122/80  Pulse 64  Temp 98.3 F (36.8 C)  Ht 5' 5.75" (1.67 m)  Wt 215 lb (97.523 kg)  BMI 34.97 kg/m2  General:  Well-developed,well-nourished,in no acute distress; alert,appropriate and cooperative throughout examination Head:  normocephalic and atraumatic.   Eyes:  vision grossly intact, pupils equal, pupils  round, and pupils reactive to light.   Ears:  R ear normal and L ear normal.   Nose:  no external deformity.   Mouth:  good dentition.   Neck:  No deformities, masses, or tenderness noted. Breasts:  No mass, nodules, thickening, tenderness, bulging, retraction, inflamation, nipple discharge or skin changes noted.   Lungs:  Normal respiratory effort, chest expands symmetrically. Lungs are clear to auscultation, no crackles or wheezes. Heart:  Normal rate and regular rhythm. S1 and S2 normal without gallop, murmur, click, rub or other  extra sounds. Abdomen:  Bowel sounds positive,abdomen soft and non-tender without masses, organomegaly or hernias noted. Rectal:  no external abnormalities.   Genitalia:  Pelvic Exam:        External: normal female genitalia without lesions or masses        Vagina: normal without lesions or masses        Cervix: normal without lesions or masses        Adnexa: normal bimanual exam without masses or fullness        Uterus: normal by palpation        Pap smear: performed Msk:  No deformity or scoliosis noted of thoracic or lumbar spine.   Extremities:  No clubbing, cyanosis, edema, or deformity noted with normal full range of motion of all joints.   Neurologic:  alert & oriented X3 and gait normal.   Skin:  Intact without suspicious lesions or rashes Cervical Nodes:  No lymphadenopathy noted Axillary Nodes:  No palpable lymphadenopathy Psych:  Cognition and judgment appear intact. Alert and cooperative with normal attention span and concentration. No apparent delusions, illusions, hallucinations    Assessment and Plan:  1. Routine general medical examination at a health care facility  Reviewed preventive care protocols, scheduled due services, and updated immunizations Discussed nutrition, exercise, diet, and healthy lifestyle.   2. ADD  Stable off rx.  3. DEPRESSION  Stable on current dose of Cymbalta.

## 2012-01-31 NOTE — Patient Instructions (Addendum)
Great to see you-- you look wonderful!  Health Maintenance, Females A healthy lifestyle and preventative care can promote health and wellness.  Maintain regular health, dental, and eye exams.  Eat a healthy diet. Foods like vegetables, fruits, whole grains, low-fat dairy products, and lean protein foods contain the nutrients you need without too many calories. Decrease your intake of foods high in solid fats, added sugars, and salt. Get information about a proper diet from your caregiver, if necessary.  Regular physical exercise is one of the most important things you can do for your health. Most adults should get at least 150 minutes of moderate-intensity exercise (any activity that increases your heart rate and causes you to sweat) each week. In addition, most adults need muscle-strengthening exercises on 2 or more days a week.   Maintain a healthy weight. The body mass index (BMI) is a screening tool to identify possible weight problems. It provides an estimate of body fat based on height and weight. Your caregiver can help determine your BMI, and can help you achieve or maintain a healthy weight. For adults 20 years and older:  A BMI below 18.5 is considered underweight.  A BMI of 18.5 to 24.9 is normal.  A BMI of 25 to 29.9 is considered overweight.  A BMI of 30 and above is considered obese.  Maintain normal blood lipids and cholesterol by exercising and minimizing your intake of saturated fat. Eat a balanced diet with plenty of fruits and vegetables. Blood tests for lipids and cholesterol should begin at age 89 and be repeated every 5 years. If your lipid or cholesterol levels are high, you are over 50, or you are a high risk for heart disease, you may need your cholesterol levels checked more frequently.Ongoing high lipid and cholesterol levels should be treated with medicines if diet and exercise are not effective.  If you smoke, find out from your caregiver how to quit. If you do  not use tobacco, do not start.  If you are pregnant, do not drink alcohol. If you are breastfeeding, be very cautious about drinking alcohol. If you are not pregnant and choose to drink alcohol, do not exceed 1 drink per day. One drink is considered to be 12 ounces (355 mL) of beer, 5 ounces (148 mL) of wine, or 1.5 ounces (44 mL) of liquor.  Avoid use of street drugs. Do not share needles with anyone. Ask for help if you need support or instructions about stopping the use of drugs.  High blood pressure causes heart disease and increases the risk of stroke. Blood pressure should be checked at least every 1 to 2 years. Ongoing high blood pressure should be treated with medicines, if weight loss and exercise are not effective.  If you are 39 to 45 years old, ask your caregiver if you should take aspirin to prevent strokes.  Diabetes screening involves taking a blood sample to check your fasting blood sugar level. This should be done once every 3 years, after age 8, if you are within normal weight and without risk factors for diabetes. Testing should be considered at a younger age or be carried out more frequently if you are overweight and have at least 1 risk factor for diabetes.  Breast cancer screening is essential preventative care for women. You should practice "breast self-awareness." This means understanding the normal appearance and feel of your breasts and may include breast self-examination. Any changes detected, no matter how small, should be reported to a caregiver.  Women in their 48s and 30s should have a clinical breast exam (CBE) by a caregiver as part of a regular health exam every 1 to 3 years. After age 37, women should have a CBE every year. Starting at age 70, women should consider having a mammogram (breast X-ray) every year. Women who have a family history of breast cancer should talk to their caregiver about genetic screening. Women at a high risk of breast cancer should talk to  their caregiver about having an MRI and a mammogram every year.  The Pap test is a screening test for cervical cancer. Women should have a Pap test starting at age 50. Between ages 81 and 100, Pap tests should be repeated every 2 years. Beginning at age 24, you should have a Pap test every 3 years as long as the past 3 Pap tests have been normal. If you had a hysterectomy for a problem that was not cancer or a condition that could lead to cancer, then you no longer need Pap tests. If you are between ages 56 and 2, and you have had normal Pap tests going back 10 years, you no longer need Pap tests. If you have had past treatment for cervical cancer or a condition that could lead to cancer, you need Pap tests and screening for cancer for at least 20 years after your treatment. If Pap tests have been discontinued, risk factors (such as a new sexual partner) need to be reassessed to determine if screening should be resumed. Some women have medical problems that increase the chance of getting cervical cancer. In these cases, your caregiver may recommend more frequent screening and Pap tests.  The human papillomavirus (HPV) test is an additional test that may be used for cervical cancer screening. The HPV test looks for the virus that can cause the cell changes on the cervix. The cells collected during the Pap test can be tested for HPV. The HPV test could be used to screen women aged 22 years and older, and should be used in women of any age who have unclear Pap test results. After the age of 34, women should have HPV testing at the same frequency as a Pap test.  Practice safe sex. Use condoms and avoid high-risk sexual practices to reduce the spread of sexually transmitted infections (STIs). Sexually active women aged 50 and younger should be checked for Chlamydia, which is a common sexually transmitted infection. Older women with new or multiple partners should also be tested for Chlamydia. Testing for other STIs  is recommended if you are sexually active and at increased risk.  Osteoporosis is a disease in which the bones lose minerals and strength with aging. This can result in serious bone fractures. The risk of osteoporosis can be identified using a bone density scan. Women ages 71 and over and women at risk for fractures or osteoporosis should discuss screening with their caregivers. Ask your caregiver whether you should be taking a calcium supplement or vitamin D to reduce the rate of osteoporosis.  Menopause can be associated with physical symptoms and risks. Hormone replacement therapy is available to decrease symptoms and risks. You should talk to your caregiver about whether hormone replacement therapy is right for you.  Use sunscreen with a sun protection factor (SPF) of 30 or greater. Apply sunscreen liberally and repeatedly throughout the day. You should seek shade when your shadow is shorter than you. Protect yourself by wearing long sleeves, pants, a wide-brimmed hat, and sunglasses year  round, whenever you are outdoors.  Notify your caregiver of new moles or changes in moles, especially if there is a change in shape or color. Also notify your caregiver if a mole is larger than the size of a pencil eraser.  Stay current with your immunizations. Document Released: 10/18/2010 Document Revised: 06/27/2011 Document Reviewed: 10/18/2010 [facility] Patient Information 2013 [place], [place].

## 2012-02-03 ENCOUNTER — Other Ambulatory Visit: Payer: Self-pay | Admitting: *Deleted

## 2012-02-03 MED ORDER — FLUCONAZOLE 150 MG PO TABS: 150.0000 mg | ORAL_TABLET | Freq: Once | ORAL | Status: DC

## 2012-02-15 ENCOUNTER — Telehealth: Payer: Self-pay | Admitting: Family Medicine

## 2012-02-15 MED ORDER — HYDROCORTISONE ACE-PRAMOXINE 1-1 % RE FOAM: 1.0000 | Freq: Two times a day (BID) | RECTAL | Status: DC

## 2012-02-15 NOTE — Telephone Encounter (Signed)
Caller: Latonda/Patient; Patient Name: Bjorn Pippin; PCP: Ruthe Mannan [facility]); Best Callback Phone Number: [phone]. Caller reports she is having some problems with hemorrhoids and would like Rx called in. Caller reports she did not sleep last night due to hemorrhoids. Caller would like Proctofoam as she has gotten before and a suppository to use at hs. Pharmacy is [facility] in [place]. Per Rectal Symptoms, Pain in rectum or rectal area AND hemorrhoids that have not been evaluated or self care is not working, See Provider within 72 hours. Caller advised a note will be sent for PCP as requested. CALLER WOULD LIKE RX PLEASE.

## 2012-02-15 NOTE — Telephone Encounter (Signed)
Left message advising patient that script has been sent in.

## 2012-02-15 NOTE — Telephone Encounter (Signed)
Rx sent 

## 2012-02-23 ENCOUNTER — Other Ambulatory Visit: Payer: Self-pay

## 2012-02-23 MED ORDER — AMPHETAMINE-DEXTROAMPHET ER 30 MG PO CP24: ORAL_CAPSULE | ORAL | Status: DC

## 2012-02-23 NOTE — Telephone Encounter (Signed)
Pt request rx adderall. Pt was not taking but decided needs to take. Call when ready for pick up.

## 2012-02-23 NOTE — Telephone Encounter (Signed)
Left message advising pt that script is ready for pickup. 

## 2012-03-07 ENCOUNTER — Ambulatory Visit: Payer: Self-pay | Admitting: Family Medicine

## 2012-03-08 ENCOUNTER — Encounter: Payer: Self-pay | Admitting: *Deleted

## 2012-03-08 ENCOUNTER — Encounter: Payer: Self-pay | Admitting: Family Medicine

## 2012-04-29 IMAGING — US TRANSABDOMINAL ULTRASOUND OF PELVIS
1 series · 17 of 25 positions shown · non-contrast
Comparison: none

REASON FOR EXAM: enlarged uterus
COMMENTS:

[Series 1: transabdominal ultrasound of pelvis · 17 of 26 slices shown]
[im 1/26]
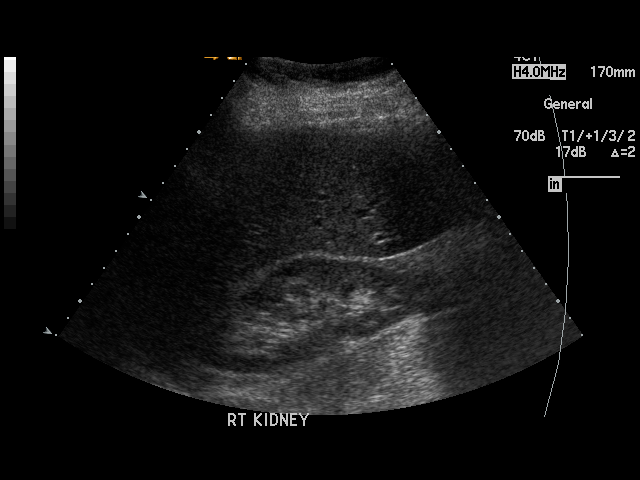
[im 3/26]
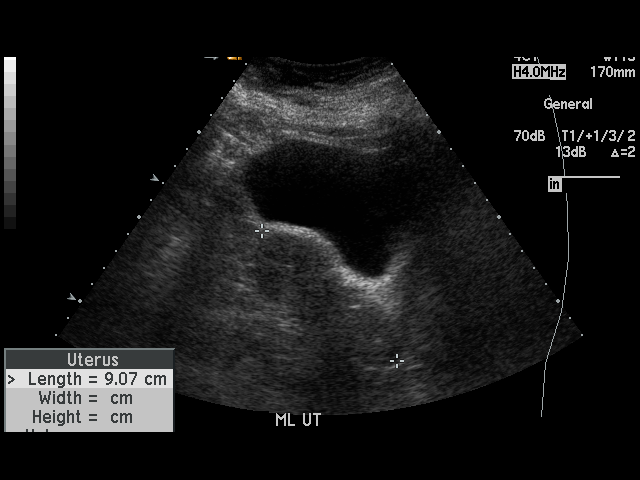
[im 4/26]
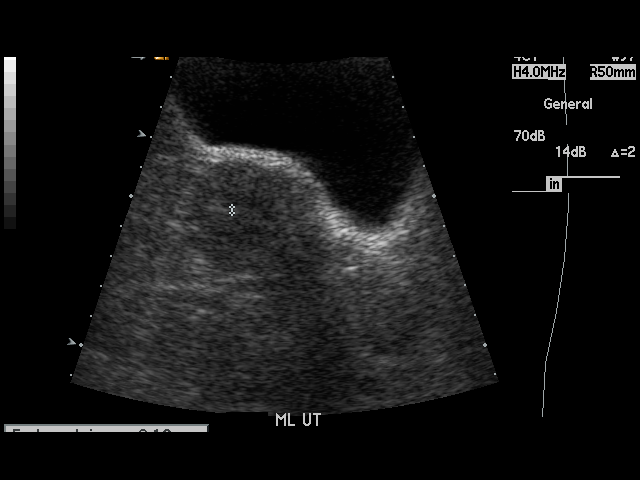
[im 6/26]
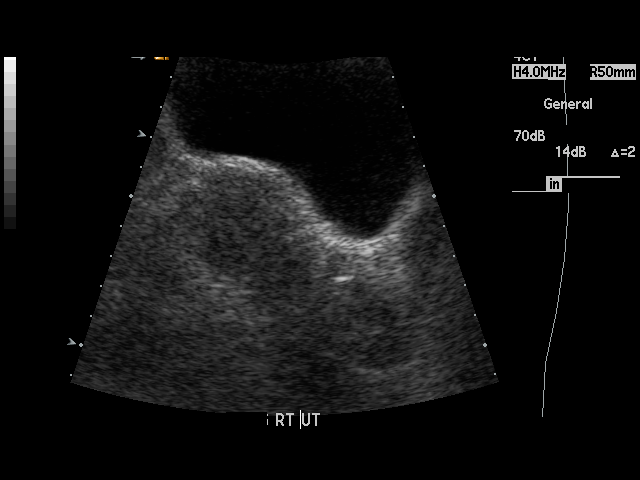
[im 7/26]
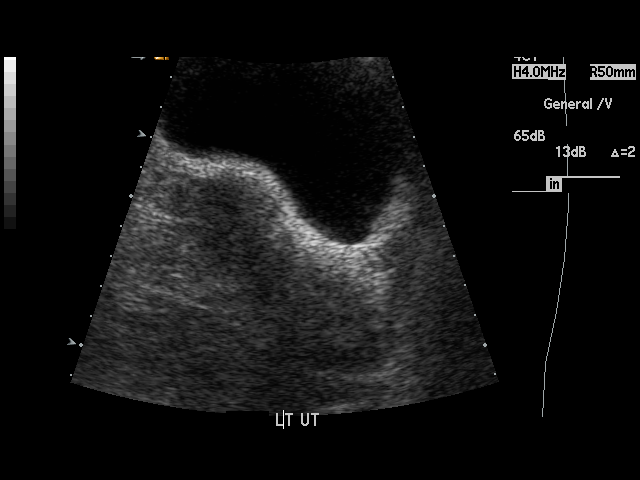
[im 9/26]
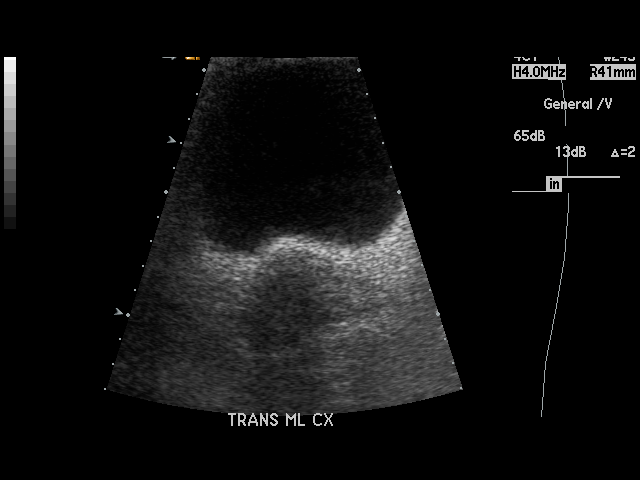
[im 10/26]
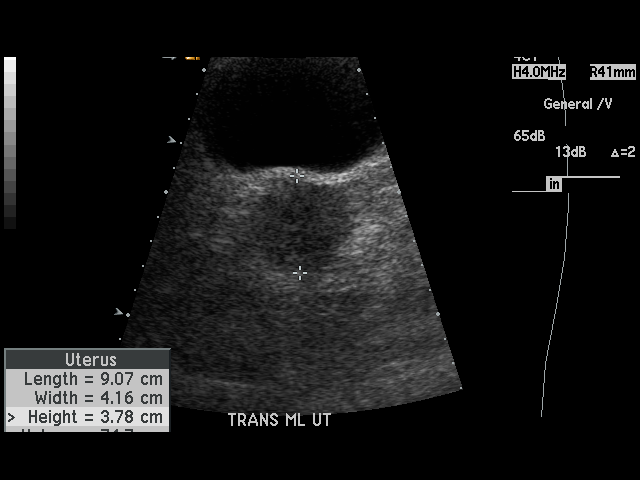
[im 12/26]
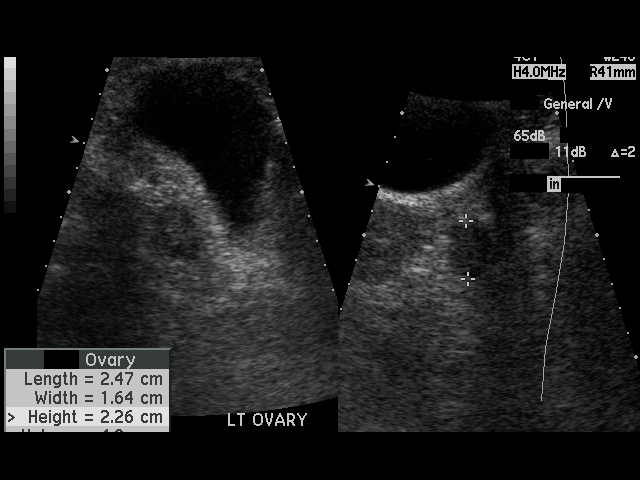
[im 13/26]
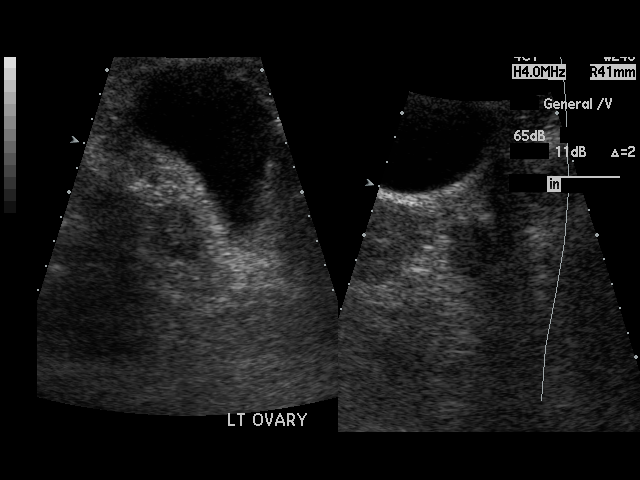
[im 14/26]
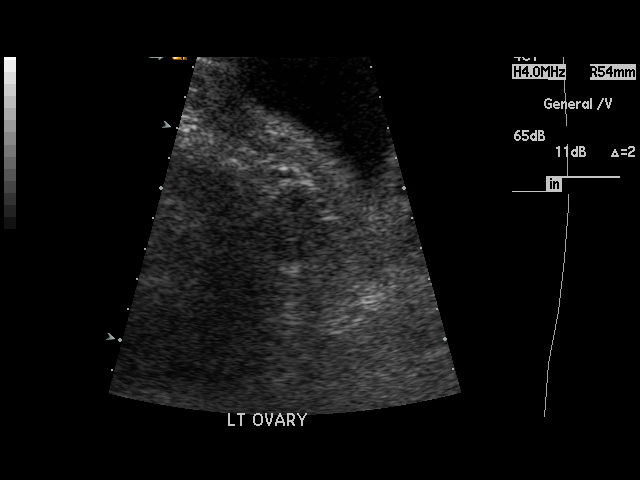
[im 16/26]
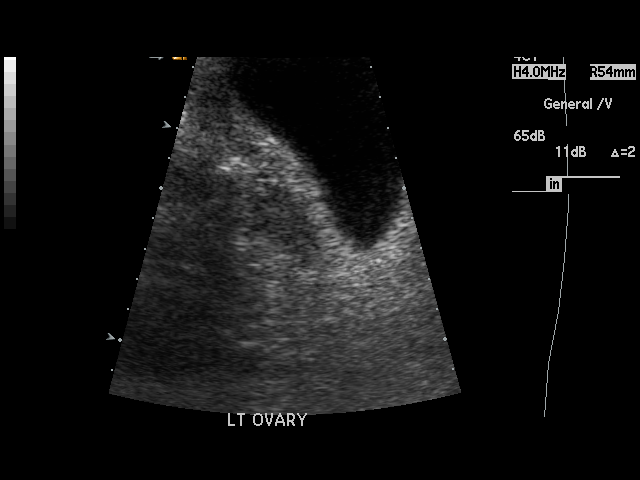
[im 17/26]
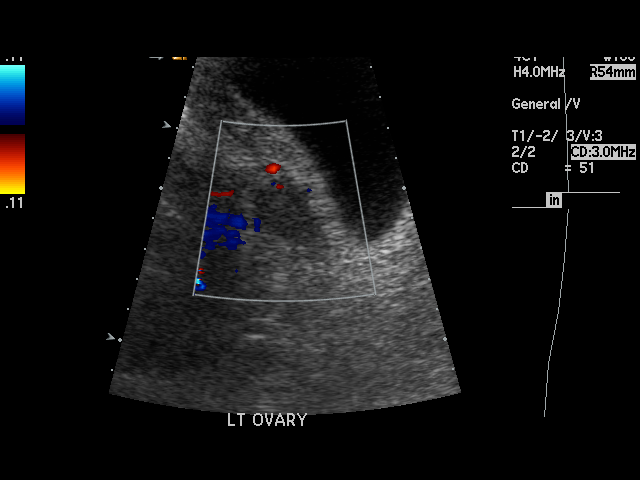
[im 19/26]
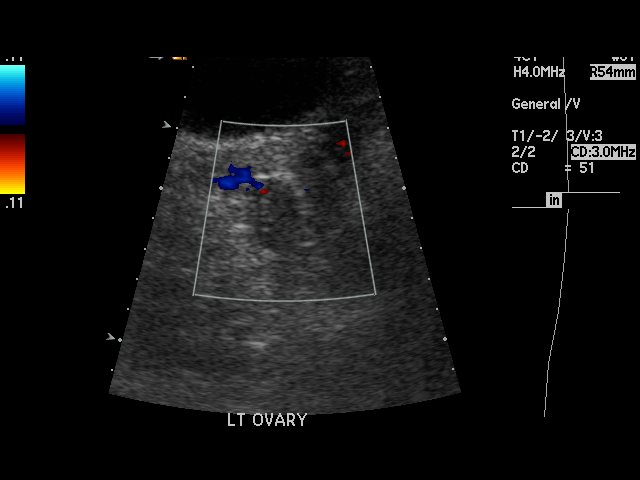
[im 20/26]
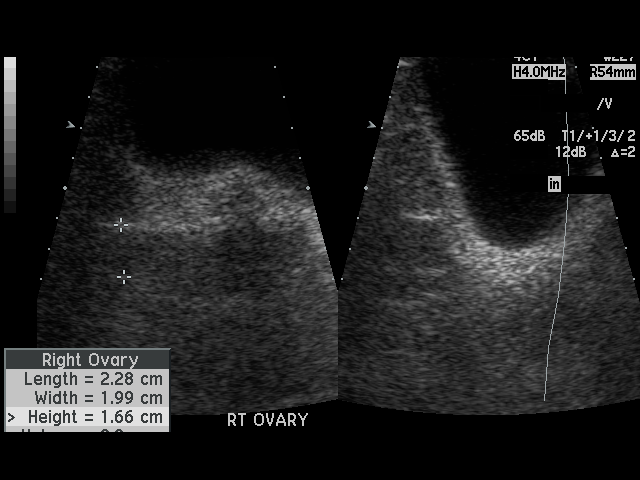
[im 22/26]
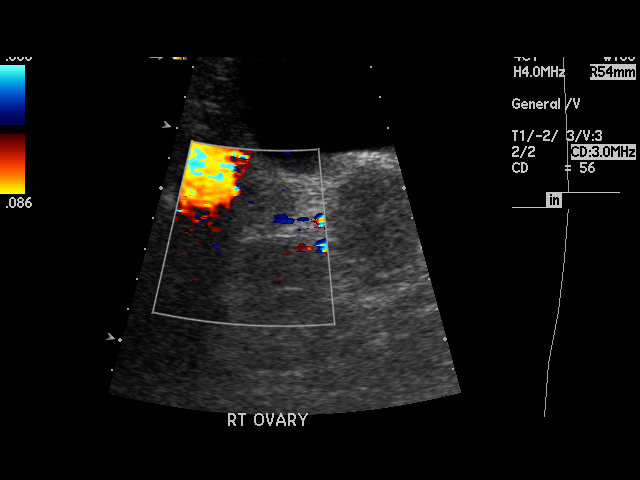
[im 23/26]
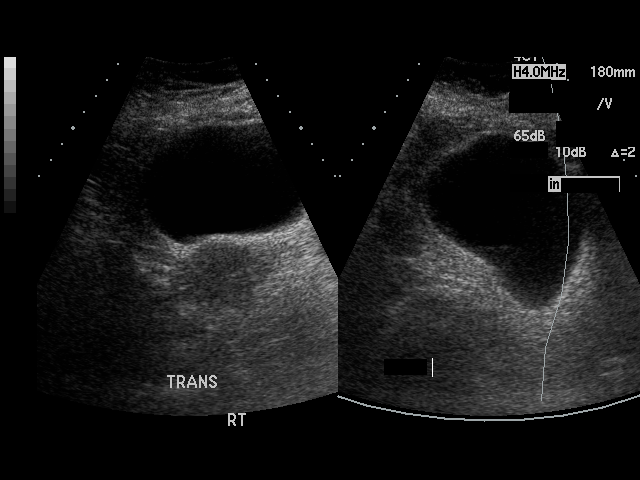
[im 26/26]
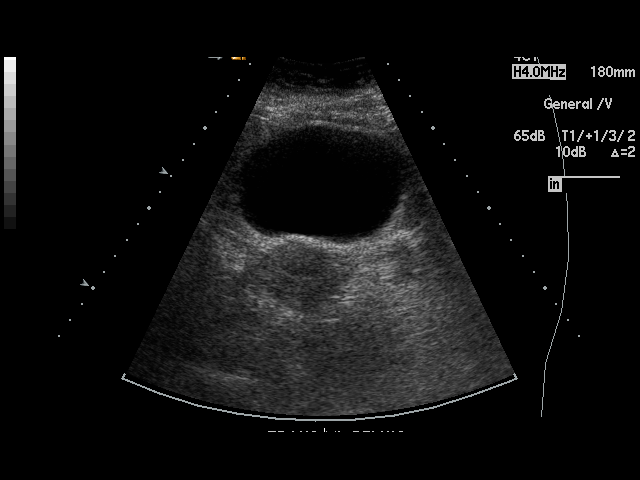

[17 of 25 positions shown; findings below may reference images not displayed]

PROCEDURE:     DHAMINI - DHAMINI PELVIS NON-OB  - December 03, 2010  [DATE]

RESULT:     Transabdominal Pelvic Ultrasound was performed. The uterus
measures 4.07 cm x 4.16 cm x 3.78 cm. The endometrium measures 1.9 mm in
thickness. No uterine mass is seen. The right and left ovaries are
visualized. The right ovary measures 2.28 cm at maximum diameter and the
left ovary measures 2.47 cm at maximum diameter. No abnormal adnexal masses
are seen. There is no free fluid observed in the pelvis. The visualized
portion of the urinary bladder is normal in appearance.
IMPRESSION: 1.  No significant sonographic abnormalities are identified.
2.  No uterine enlargement or uterine mass is seen.
3.  No adnexal masses are identified.
4.  The visualized portion of the urinary bladder is normal in appearance.
5.  The kidneys are visualized bilaterally and show no hydronephrosis.

## 2012-05-07 ENCOUNTER — Telehealth: Payer: Self-pay

## 2012-05-07 NOTE — Telephone Encounter (Signed)
It does sound like a stomach bug.  Stay hydrated and please update [place] with symptoms.

## 2012-05-07 NOTE — Telephone Encounter (Signed)
Pt said nausea and vomiting and diarrhea since 05/07/12 at 2 am taking phenergan and that helped N&V; pt said diarrhea has lessened,pt fever 99.1, feels achy in hips,chills,weak. Pt wants to know if Dr Dayton Martes needs to see pt pt wants to know if Dr Dayton Martes thinks pt has flu or just stomach bug; pt does not want to schedule appt until Dr Dayton Martes advises.[facility]

## 2012-05-08 NOTE — Telephone Encounter (Signed)
Spoke with patient.  She said she's feeling much better today.

## 2012-06-19 ENCOUNTER — Other Ambulatory Visit: Payer: Self-pay | Admitting: *Deleted

## 2012-06-19 MED ORDER — OMEPRAZOLE 40 MG PO CPDR: 40.0000 mg | DELAYED_RELEASE_CAPSULE | Freq: Every day | ORAL | Status: DC

## 2012-07-23 ENCOUNTER — Other Ambulatory Visit: Payer: Self-pay | Admitting: *Deleted

## 2012-07-23 MED ORDER — DULOXETINE HCL 60 MG PO CPEP: 60.0000 mg | ORAL_CAPSULE | Freq: Every day | ORAL | Status: DC

## 2012-08-24 ENCOUNTER — Other Ambulatory Visit: Payer: Self-pay

## 2012-08-24 MED ORDER — AMPHETAMINE-DEXTROAMPHET ER 30 MG PO CP24: ORAL_CAPSULE | ORAL | Status: DC

## 2012-08-24 NOTE — Telephone Encounter (Signed)
Advised patient script is ready for pick up. 

## 2012-08-24 NOTE — Telephone Encounter (Signed)
Pt request rx adderall. Call when ready for pick up. 

## 2012-08-24 NOTE — Telephone Encounter (Signed)
Ok to print and put on my desk for signature. 

## 2012-10-24 ENCOUNTER — Other Ambulatory Visit: Payer: Self-pay | Admitting: *Deleted

## 2012-10-24 MED ORDER — OMEPRAZOLE 20 MG PO CPDR: DELAYED_RELEASE_CAPSULE | ORAL | Status: DC

## 2012-10-24 MED ORDER — DULOXETINE HCL 60 MG PO CPEP: 60.0000 mg | ORAL_CAPSULE | Freq: Every day | ORAL | Status: DC

## 2012-10-29 ENCOUNTER — Encounter: Payer: Self-pay | Admitting: Radiology

## 2012-10-30 ENCOUNTER — Encounter: Payer: Self-pay | Admitting: Family Medicine

## 2012-10-30 ENCOUNTER — Ambulatory Visit (INDEPENDENT_AMBULATORY_CARE_PROVIDER_SITE_OTHER): Payer: BC Managed Care – PPO | Admitting: Family Medicine

## 2012-10-30 VITALS — BP 102/64 | HR 75 | Temp 98.1°F | Wt 224.2 lb

## 2012-10-30 DIAGNOSIS — F988 Other specified behavioral and emotional disorders with onset usually occurring in childhood and adolescence: Secondary | ICD-10-CM

## 2012-10-30 DIAGNOSIS — F329 Major depressive disorder, single episode, unspecified: Secondary | ICD-10-CM

## 2012-10-30 MED ORDER — AMPHETAMINE-DEXTROAMPHET ER 15 MG PO CP24: ORAL_CAPSULE | ORAL | Status: DC

## 2012-10-30 NOTE — Progress Notes (Signed)
47 yo to follow up ADD and depression.     ADD- doing very well.  Doing better since she quit her job.  She does still need to take occassional Adderall when there is something she really needs to focus on.  She wonders if she should decrease dose as she sometimes feels a little "too reved up."  Taking 30 mg twice daily currently but has cut back to once a day.    Depression- improved with Cymbalta and with quitting her job.  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 She does still have moments of feeling low.  Denies any anxiety.  No SI or HI.  Patient Active Problem List   Diagnosis Date Noted  . GERD (gastroesophageal reflux disease) 03/04/2011  . Abdominal pain, lower 11/11/2010  . Routine general medical examination at a health care facility 10/21/2010  . ADD 05/24/2010  . DEPRESSION 03/25/2010   Past Medical History  Diagnosis Date  . Depression   . Genital warts   . ADHD (attention deficit hyperactivity disorder)    Past Surgical History  Procedure Laterality Date  . Endometrial ablation  2008   History  Substance Use Topics  . Smoking status: Former Games developer  . Smokeless tobacco: Not on file  . Alcohol Use: Yes   Family History  Problem Relation Age of Onset  . Cancer Mother 62    breast   . Cancer Other     breast   No Known Allergies Current Outpatient Prescriptions on File Prior to Visit  Medication Sig Dispense Refill  . DULoxetine (CYMBALTA) 60 MG capsule Take 1 capsule (60 mg total) by mouth daily.  30 capsule  2  . omeprazole (PRILOSEC) 20 MG capsule Take 2 tablets by mouth daily  84 capsule  1  . cyanocobalamin (,VITAMIN B-12,) 1000 MCG/ML injection Inject 1 mL (1,000 mcg total) into the muscle every 14 (fourteen) days. One injection every 14 days  30 mL  12  . hydrocortisone-pramoxine (PROCTOFOAM HC) rectal foam Place 1 applicator rectally 2 (two) times daily.  10 g  0  . Meth-Hyo-M Bl-Na Phos-Ph Sal (URIBEL PO) Take 1  tablet by mouth 4 (four) times daily.        . pentosan polysulfate (ELMIRON) 100 MG capsule Take 100 mg by mouth 3 (three) times daily.         No current facility-administered medications on file prior to visit.   The PMH, PSH, Social History, Family History, Medications, and allergies have been reviewed in [facility], and have been updated if relevant.      Review of Systems       See HPI   Patient Active Problem List   Diagnosis Date Noted  . GERD (gastroesophageal reflux disease) 03/04/2011  . Abdominal pain, lower 11/11/2010  . Routine general medical examination at a health care facility 10/21/2010  . CHOLELITHIASIS, WITH OBSTRUCTION 06/10/2010  . ADD 05/24/2010  . DEPRESSION 03/25/2010   Past Medical History  Diagnosis Date  . Depression   . Genital warts   . ADHD (attention deficit hyperactivity disorder)    Past Surgical History  Procedure Laterality Date  . Endometrial ablation  2008   History  Substance Use Topics  . Smoking status: Former Games developer  . Smokeless tobacco: Not on file  . Alcohol Use: Yes   Family History  Problem Relation Age of Onset  . Cancer Mother 60    breast   . Cancer Other  breast   No Known Allergies Current Outpatient Prescriptions on File Prior to Visit  Medication Sig Dispense Refill  . amphetamine-dextroamphetamine (ADDERALL XR) 30 MG 24 hr capsule Take one tablet by mouth in the morning and one tablet at noon  60 capsule  0  . DULoxetine (CYMBALTA) 60 MG capsule Take 1 capsule (60 mg total) by mouth daily.  30 capsule  2  . omeprazole (PRILOSEC) 20 MG capsule Take 2 tablets by mouth daily  84 capsule  1  . cyanocobalamin (,VITAMIN B-12,) 1000 MCG/ML injection Inject 1 mL (1,000 mcg total) into the muscle every 14 (fourteen) days. One injection every 14 days  30 mL  12  . hydrocortisone-pramoxine (PROCTOFOAM HC) rectal foam Place 1 applicator rectally 2 (two) times daily.  10 g  0  . Meth-Hyo-M Bl-Na Phos-Ph Sal (URIBEL PO) Take  1 tablet by mouth 4 (four) times daily.        . pentosan polysulfate (ELMIRON) 100 MG capsule Take 100 mg by mouth 3 (three) times daily.         No current facility-administered medications on file prior to visit.   The PMH, PSH, Social History, Family History, Medications, and allergies have been reviewed in [facility], and have been updated if relevant.  Physical Exam BP 102/64  Pulse 75  Temp(Src) 98.1 F (36.7 C) (Oral)  Wt 224 lb 4 oz (101.719 kg)  BMI 36.47 kg/m2  SpO2 98%  General:  Well-developed,well-nourished,in no acute distress; alert,appropriate and cooperative throughout examination Psych:  Cognition and judgment appear intact. Alert and cooperative with normal attention span and concentration. No apparent delusions, illusions, hallucinations    Assessment and Plan:  1. ADD >25 min spent with face to face with patient, >50% counseling and/or coordinating care  Decrease Adderall to 15 mg daily.  The patient indicates understanding of these issues and agrees with the plan.   2. DEPRESSION She asked many questions about other meds.  Saw a commercial for abilify.  I explained that I do not think she needs an antipsychotic. Overall, symptoms seem stable.  She has arguments and stressors with her mother which makes her a little "stressed out," but overall does not feel depressed. Again advised psychotherapy. The patient indicates understanding of these issues and agrees with the plan.

## 2012-10-30 NOTE — Patient Instructions (Addendum)
Good to see you. We are decreasing your Adderall to 15 mg daily.  Keep me posted with your symptoms.  Again, please make an appointment with a therapist.

## 2012-11-09 ENCOUNTER — Encounter: Payer: Self-pay | Admitting: Family Medicine

## 2013-01-18 ENCOUNTER — Telehealth: Payer: Self-pay

## 2013-01-18 ENCOUNTER — Other Ambulatory Visit: Payer: Self-pay | Admitting: Family Medicine

## 2013-01-18 MED ORDER — DULOXETINE HCL 60 MG PO CPEP: 60.0000 mg | ORAL_CAPSULE | Freq: Every day | ORAL | Status: DC

## 2013-01-18 NOTE — Telephone Encounter (Signed)
Last filled 12/24/12 

## 2013-01-18 NOTE — Telephone Encounter (Signed)
Left message to pt to return call.

## 2013-01-18 NOTE — Telephone Encounter (Signed)
pt knows that cymbalta refills have been requested by pharmacy. Pt wants to switch from cymbalta back to lexapro 20 mg. Pt said if takes cymbalta late pt is extremely dizzy; if pt takes Cymbalta on time no dizziness or problem. Pt said takes med late once in a while but dizziness is severe and that never happened when took Lexapro late. [facility]. Please advise. Pt request cb when done.

## 2013-01-18 NOTE — Telephone Encounter (Signed)
I reviewed my notes from last fall and it looks like she felt Lexapro was ineffective.  I would recommend setting an alert or alarm of some type to remember to take her Cymbalta on time before we switch back unless she is truly insistent on making this change.

## 2013-02-21 ENCOUNTER — Other Ambulatory Visit: Payer: Self-pay

## 2013-03-25 ENCOUNTER — Other Ambulatory Visit: Payer: Self-pay | Admitting: Family Medicine

## 2013-03-25 ENCOUNTER — Other Ambulatory Visit: Payer: Self-pay | Admitting: *Deleted

## 2013-03-25 MED ORDER — OMEPRAZOLE 20 MG PO CPDR: DELAYED_RELEASE_CAPSULE | ORAL | Status: DC

## 2013-03-25 MED ORDER — DULOXETINE HCL 60 MG PO CPEP: 60.0000 mg | ORAL_CAPSULE | Freq: Every day | ORAL | Status: DC

## 2013-03-25 NOTE — Telephone Encounter (Signed)
Pt states she has been taking Cymbalta.  She said she doesn't like it and wants to go back to Lexapro.  She also said that her pharmacy probably sent a request to refill Cymbalta but she doesn't want to refill it.  She would like to talk to someone on the phone about switching medicine. [phone]

## 2013-03-26 NOTE — Telephone Encounter (Signed)
Ok to switch to lexapro 20 mg daily. All she has to do is finish out the cymbalta then start the lexapro

## 2013-03-26 NOTE — Telephone Encounter (Signed)
Patient stated that she has already picked up the Cymbalta because she was completely out. Patient stated that she does want to switch back to Lexapro. Please advise if she can do this and how to make the change?  . Pharmacy [facility].

## 2013-03-27 MED ORDER — ESCITALOPRAM OXALATE 20 MG PO TABS: 20.0000 mg | ORAL_TABLET | Freq: Every day | ORAL | Status: DC

## 2013-03-27 NOTE — Addendum Note (Signed)
Addended by: Sydell Axon C on: 03/27/2013 10:17 AM   Modules accepted: Orders

## 2013-03-27 NOTE — Telephone Encounter (Signed)
Krystal Mays calling from [facility] and said pt picked up cymbalta rx on 03/25/13 and now has lexapro rx to fill. Please advise. Jessica request cb.

## 2013-03-27 NOTE — Telephone Encounter (Signed)
#  30, 2 refills 

## 2013-03-27 NOTE — Telephone Encounter (Signed)
Rx sent through e-scribe  

## 2013-03-27 NOTE — Telephone Encounter (Signed)
Changes made on medication sheet. Please advise quantity and number of refills.

## 2013-03-27 NOTE — Addendum Note (Signed)
Addended by: Roena Malady on: 03/27/2013 04:08 PM   Modules accepted: Orders

## 2013-03-27 NOTE — Telephone Encounter (Signed)
Spoke to [facility] in reference to the directions for medication. Pt was seen today by Nicki Reaper and she advised pt to finish Cymbalta Rx before starting Lexapro

## 2013-05-21 ENCOUNTER — Ambulatory Visit (INDEPENDENT_AMBULATORY_CARE_PROVIDER_SITE_OTHER): Payer: BC Managed Care – PPO | Admitting: Internal Medicine

## 2013-05-21 ENCOUNTER — Encounter: Payer: Self-pay | Admitting: Internal Medicine

## 2013-05-21 VITALS — BP 118/80 | HR 64 | Temp 98.5°F | Wt 233.8 lb

## 2013-05-21 DIAGNOSIS — T2104XA Burn of unspecified degree of lower back, initial encounter: Secondary | ICD-10-CM

## 2013-05-21 DIAGNOSIS — T2145XA Corrosion of unspecified degree of buttock, initial encounter: Secondary | ICD-10-CM

## 2013-05-21 DIAGNOSIS — IMO0002 Reserved for concepts with insufficient information to code with codable children: Secondary | ICD-10-CM

## 2013-05-21 MED ORDER — SILVER SULFADIAZINE 1 % EX CREA: 1.0000 "application " | TOPICAL_CREAM | Freq: Every day | CUTANEOUS | Status: DC

## 2013-05-21 NOTE — Progress Notes (Signed)
Pre-visit discussion using our clinic review tool. No additional management support is needed unless otherwise documented below in the visit note.  

## 2013-05-21 NOTE — Patient Instructions (Signed)
Chemical Burn °Many chemicals can burn the skin. A chemical burn should be flushed with cool water and checked by an emergency caregiver. Your skin is a natural barrier to infection. It is the largest organ of your body. Burns damage this natural protection. To help prevent infection, it is very important to follow your caregiver's instructions in the care of your burn.  °Many industrial chemicals may cause burns. These chemicals include acids, alkalis, and organic compounds such as petroleum, phenol, bitumen, tar, and grease. °When acids come in contact with the skin, they cause an immediate change in the skin. Acid burns produce significant pain and form a scab (eschar). Usually, the immediate skin changes are the only damage from an acid burn. However, exposure to formic acid, chromic acid, or hydrofluoric acid may affect the whole body and may even be life-threatening. °Alkalis include lye, cement, lime, and many chemicals with "hydroxide" in their name. An alkali burn may be less apparent than an acid burn at first. However, alkalis may cause greater tissue damage. It is important to be aware of any chemicals you are using. Treat any exposure to skin, eyes, or mucous membranes (nose, mouth, throat) as a potential emergency. °PREVENTION °· Avoid exposure to toxic chemicals that can cause burns. °· Store chemicals out of the reach of children. °· Use protective gloves when handling dangerous chemicals. °HOME CARE INSTRUCTIONS  °· Wash your hands well before changing your bandage. °· Change your bandage as often as directed by your caregiver. °· Remove the old bandage. If the bandage sticks, you may soak it off with cool, clean water. °· Cleanse the burn thoroughly but gently with mild soap and water. °· Pat the area dry with a clean, dry cloth. °· Apply a thin layer of antibacterial cream to the burn. °· Apply a clean bandage as instructed by your caregiver. °· Keep the bandage as clean and dry as  possible. °· Elevate the affected area for the first 24 hours, then as instructed by your caregiver. °· Only take over-the-counter or prescription medicines for pain, discomfort, or fever as directed by your caregiver. °· Keep all follow-up appointments. This is important. This is how your caregiver can tell if your treatment is working. °SEEK IMMEDIATE MEDICAL CARE IF:  °· You develop excessive pain. °· You develop redness, tenderness, swelling, or red streaks near the burn. °· The burned area develops yellowish-white fluid (pus) or a bad smell. °· You have a fever. °MAKE SURE YOU:  °· Understand these instructions. °· Will watch your condition. °· Will get help right away if you are not doing well or get worse. °Document Released: 01/09/2004 Document Revised: 06/27/2011 Document Reviewed: 08/30/2010 °[employer]® Patient Information ©2014 [employer]. ° °

## 2013-05-21 NOTE — Progress Notes (Addendum)
Subjective:    Patient ID: Krystal Mays, female    DOB: 27-Aug-1965, 48 y.o.   MRN: [id number]  HPI  Pt presents to the clinic today with c/o a rash on her thighs. This started 4 days ago. She reports the rash itches and is painful at times. She tried OTC hydrocortisone cream and a baking soda bath. This treatment did seem to help. She has not come in contact with anything that she is allergic to. She does remember sitting on a toilet at work and remembers the toiled seat being wet. She has washed her skin well in case it was the cleaner that caused the rash. She is not sure if she got the rash from this or not.  Review of Systems      Past Medical History  Diagnosis Date  . Depression   . Genital warts   . ADHD (attention deficit hyperactivity disorder)     Current Outpatient Prescriptions  Medication Sig Dispense Refill  . cyanocobalamin (,VITAMIN B-12,) 1000 MCG/ML injection Inject 1 mL (1,000 mcg total) into the muscle every 14 (fourteen) days. One injection every 14 days  30 mL  12  . escitalopram (LEXAPRO) 20 MG tablet Take 1 tablet (20 mg total) by mouth daily.  30 tablet  2  . hydrocortisone-pramoxine (PROCTOFOAM HC) rectal foam Place 1 applicator rectally 2 (two) times daily.  10 g  0  . omeprazole (PRILOSEC) 20 MG capsule Take 2 tablets by mouth daily  84 capsule  5   No current facility-administered medications for this visit.    No Known Allergies  Family History  Problem Relation Age of Onset  . Cancer Mother 2142    breast   . Cancer Other     breast    History   Social History  . Marital Status: Married    Spouse Name: N/A    Number of Children: 2  . 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 Smoking status: Former Games developermoker  . Smokeless tobacco: Not on file  . Alcohol Use: Yes  . Drug Use:   . Sexual Activity:    Other Topics Concern  . Not on file   Social History Narrative  . No  narrative on file     Constitutional: Denies fever, malaise, fatigue, headache or abrupt weight changes.  Skin: Pt reports redness and rash to buttock and upper thighs. Denies lesions or ulcercations.    No other specific complaints in a complete review of systems (except as listed in HPI above).  Objective:   Physical Exam  BP 118/80  Pulse 64  Temp(Src) 98.5 F (36.9 C) (Oral)  Wt 233 lb 12 oz (106.028 kg)  SpO2 98% Wt Readings from Last 3 Encounters:  05/21/13 233 lb 12 oz (106.028 kg)  10/30/12 224 lb 4 oz (101.719 kg)  01/31/12 215 lb (97.523 kg)    General: Appears her stated age, well developed, well nourished in NAD. Skin: Warm, dry and intact. Very red, erythematous, vesicular rash noted on bilateral buttock and posterior upper thighs. The rash follows the impression of a toilet seat. Cardiovascular: Normal rate and rhythm. S1,S2 noted.  No murmur, rubs or gallops noted. No JVD or BLE edema. No carotid bruits noted. Pulmonary/Chest: Normal effort and positive vesicular breath sounds. No respiratory distress. No wheezes, rales or ronchi noted.    BMET    Component Value Date/Time  NA 136 01/26/2012 1212   K 3.8 01/26/2012 1212   CL 103 01/26/2012 1212   CO2 26 01/26/2012 1212   GLUCOSE 100* 01/26/2012 1212   BUN 15 01/26/2012 1212   CREATININE 1.0 01/26/2012 1212   CALCIUM 9.1 01/26/2012 1212    Lipid Panel     Component Value Date/Time   CHOL 177 01/26/2012 1212   TRIG 149.0 01/26/2012 1212   HDL 37.80* 01/26/2012 1212   CHOLHDL 5 01/26/2012 1212   VLDL 29.8 01/26/2012 1212   LDLCALC 109* 01/26/2012 1212    CBC    Component Value Date/Time   WBC 7.2 11/11/2010 1704   RBC 4.11 11/11/2010 1704   HGB 13.6 11/11/2010 1704   HCT 38.8 11/11/2010 1704   PLT 154.0 11/11/2010 1704   MCV 94.3 11/11/2010 1704   MCHC 35.0 11/11/2010 1704   RDW 12.8 11/11/2010 1704   LYMPHSABS 2.1 11/11/2010 1704   MONOABS 0.7 11/11/2010 1704   EOSABS 0.1 11/11/2010 1704    BASOSABS 0.0 11/11/2010 1704    Hgb A1C No results found for this basename: HGBA1C         Assessment & Plan:   Chemical burn from cleaning products used to sanitize toilet:  Advised her to continue baking soda soaks or oatmeal soaks Try to keep the area clean and dry to prevent infection eRx for Silvidene cream to affected area daily until healed Warned about the s/s of infection  RTC as needed or if symptoms persist or worsen

## 2013-05-22 ENCOUNTER — Ambulatory Visit: Payer: BC Managed Care – PPO | Admitting: Internal Medicine

## 2013-07-03 ENCOUNTER — Ambulatory Visit (INDEPENDENT_AMBULATORY_CARE_PROVIDER_SITE_OTHER): Payer: BC Managed Care – PPO | Admitting: Family Medicine

## 2013-07-03 ENCOUNTER — Encounter: Payer: Self-pay | Admitting: Family Medicine

## 2013-07-03 VITALS — BP 124/72 | HR 58 | Temp 97.7°F | Ht 65.5 in | Wt 234.8 lb

## 2013-07-03 DIAGNOSIS — F988 Other specified behavioral and emotional disorders with onset usually occurring in childhood and adolescence: Secondary | ICD-10-CM

## 2013-07-03 DIAGNOSIS — F3289 Other specified depressive episodes: Secondary | ICD-10-CM

## 2013-07-03 DIAGNOSIS — F329 Major depressive disorder, single episode, unspecified: Secondary | ICD-10-CM

## 2013-07-03 DIAGNOSIS — R52 Pain, unspecified: Secondary | ICD-10-CM

## 2013-07-03 DIAGNOSIS — K219 Gastro-esophageal reflux disease without esophagitis: Secondary | ICD-10-CM

## 2013-07-03 DIAGNOSIS — R102 Pelvic and perineal pain: Secondary | ICD-10-CM | POA: Insufficient documentation

## 2013-07-03 DIAGNOSIS — R109 Unspecified abdominal pain: Secondary | ICD-10-CM

## 2013-07-03 LAB — POCT URINALYSIS DIPSTICK
Glucose, UA: NEGATIVE
LEUKOCYTES UA: NEGATIVE
NITRITE UA: NEGATIVE
PH UA: 6
Spec Grav, UA: 1.015
Urobilinogen, UA: 0.2

## 2013-07-03 MED ORDER — BUPROPION HCL ER (XL) 150 MG PO TB24: 150.0000 mg | ORAL_TABLET | Freq: Every day | ORAL | Status: DC

## 2013-07-03 NOTE — Progress Notes (Signed)
Pre visit review using our clinic review tool, if applicable. No additional management support is needed unless otherwise documented below in the visit note. 

## 2013-07-03 NOTE — Progress Notes (Signed)
48 yo to follow up ADD and depression.     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  No longer taking Adderall.    Depression- was on Cymbalta but had some dizziness and "brain issues."  If she missed a dose, she would have these symptoms.  Was weaned off by [facility] and restarted Lexapro 20 mg daily.  Feels more moody on Lexapro than she was on Cymbalta.  Talked with her sister who is nurse practitioner who suggested Wellbutrin- we had talked about this in past but she declined it because thought she had tried it in past.  Overall feels good.  Denies any depression.  No SI or HI.  Had a mild "twinge" of suprapubic pain this am. No dysuria.  No increased urinary frequency.  No nausea, vomiting fevers or back pain.  Patient Active Problem List   Diagnosis Date Noted  . GERD (gastroesophageal reflux disease) 03/04/2011  . ADD 05/24/2010  . DEPRESSION 03/25/2010   Past Medical History  Diagnosis Date  . Depression   . Genital warts   . ADHD (attention deficit hyperactivity disorder)    Past Surgical History  Procedure Laterality Date  . Endometrial ablation  2008   History  Substance Use Topics  . Smoking status: Former Games developer  . Smokeless tobacco: Not on file  . Alcohol Use: Yes   Family History  Problem Relation Age of Onset  . Cancer Mother 53    breast   . Cancer Other     breast   No Known Allergies Current Outpatient Prescriptions on File Prior to Visit  Medication Sig Dispense Refill  . escitalopram (LEXAPRO) 20 MG tablet Take 1 tablet (20 mg total) by mouth daily.  30 tablet  2  . hydrocortisone-pramoxine (PROCTOFOAM HC) rectal foam Place 1 applicator rectally 2 (two) times daily.  10 g  0  . omeprazole (PRILOSEC) 20 MG capsule Take 2 tablets by mouth daily  84 capsule  5  . silver sulfADIAZINE (SILVADENE) 1 % cream Apply 1 application topically daily.  50 g  0  . cyanocobalamin (,VITAMIN B-12,) 1000 MCG/ML injection Inject 1 mL (1,000 mcg total)  into the muscle every 14 (fourteen) days. One injection every 14 days  30 mL  12   No current facility-administered medications on file prior to visit.   The PMH, PSH, Social History, Family History, Medications, and allergies have been reviewed in [facility], and have been updated if relevant.      Review of Systems       See HPI   Patient Active Problem List   Diagnosis Date Noted  . GERD (gastroesophageal reflux disease) 03/04/2011  . ADD 05/24/2010  . DEPRESSION 03/25/2010   Past Medical History  Diagnosis Date  . Depression   . Genital warts   . ADHD (attention deficit hyperactivity disorder)    Past Surgical History  Procedure Laterality Date  . Endometrial ablation  2008   History  Substance Use Topics  . Smoking status: Former Games developer  . Smokeless tobacco: Not on file  . Alcohol Use: Yes   Family History  Problem Relation Age of Onset  . Cancer Mother 8    breast   . Cancer Other     breast   No Known Allergies Current Outpatient Prescriptions on File Prior to Visit  Medication Sig Dispense Refill  . escitalopram (LEXAPRO) 20 MG tablet Take 1 tablet (20 mg total) by mouth daily.  30 tablet  2  .  hydrocortisone-pramoxine (PROCTOFOAM HC) rectal foam Place 1 applicator rectally 2 (two) times daily.  10 g  0  . omeprazole (PRILOSEC) 20 MG capsule Take 2 tablets by mouth daily  84 capsule  5  . silver sulfADIAZINE (SILVADENE) 1 % cream Apply 1 application topically daily.  50 g  0  . cyanocobalamin (,VITAMIN B-12,) 1000 MCG/ML injection Inject 1 mL (1,000 mcg total) into the muscle every 14 (fourteen) days. One injection every 14 days  30 mL  12   No current facility-administered medications on file prior to visit.   The PMH, PSH, Social History, Family History, Medications, and allergies [facility], and have been updated if relevant.  Physical Exam BP 124/72  Pulse 58  Temp(Src) 97.7 F (36.5 C) (Oral)  Ht 5' 5.5" (1.664 m)  Wt 234 lb 12 oz  (106.482 kg)  BMI 38.46 kg/m2  SpO2 98%  General:  Well-developed,well-nourished,in no acute distress; alert,appropriate and cooperative throughout examination Abd:  Soft, NT, No CVA tenderness Psych:  Cognition and judgment appear intact. Alert and cooperative with normal attention span and concentration. No apparent delusions, illusions, hallucinations    Assessment and Plan:

## 2013-07-03 NOTE — Assessment & Plan Note (Signed)
>  25 minutes spent in face to face time with patient, >50% spent in counselling or coordination of care Now that she is no longer taking Adderall, Wellbutrin would be a good choice to help with concentration.  Will add to Lexapro.  Follow up in 3 weeks by phone or OV. The patient indicates understanding of these issues and agrees with the plan.

## 2013-07-03 NOTE — Assessment & Plan Note (Signed)
UA pos for trace RBC only.  Did have some vaginal spotting recently. Send for culture. Will repeat UA at next office visit to make sure hematuria has resolved. The patient indicates understanding of these issues and agrees with the plan.

## 2013-07-03 NOTE — Patient Instructions (Signed)
Good to see you.  You look great!  We are adding Wellbutrin 150 mg XL daily to your current dose of Lexapro 20 mg daily.  Call me or come see me in a few weeks.

## 2013-07-04 LAB — URINE CULTURE
COLONY COUNT: NO GROWTH
Organism ID, Bacteria: NO GROWTH

## 2013-07-05 ENCOUNTER — Ambulatory Visit: Payer: BC Managed Care – PPO | Admitting: Family Medicine

## 2013-07-16 ENCOUNTER — Other Ambulatory Visit: Payer: Self-pay

## 2013-07-16 MED ORDER — ESCITALOPRAM OXALATE 20 MG PO TABS: 20.0000 mg | ORAL_TABLET | Freq: Every day | ORAL | Status: DC

## 2013-07-16 NOTE — Telephone Encounter (Signed)
Pt left v/m requesting refill on lexapro to [facility]; [facility] and request pt to cb next week with update how feeling.

## 2013-08-02 ENCOUNTER — Telehealth: Payer: Self-pay

## 2013-08-02 ENCOUNTER — Other Ambulatory Visit: Payer: Self-pay | Admitting: Family Medicine

## 2013-08-02 MED ORDER — ESCITALOPRAM OXALATE 10 MG PO TABS: 10.0000 mg | ORAL_TABLET | Freq: Every day | ORAL | Status: DC

## 2013-08-02 MED ORDER — BUPROPION HCL ER (XL) 300 MG PO TB24: 300.0000 mg | ORAL_TABLET | Freq: Every day | ORAL | Status: DC

## 2013-08-02 NOTE — Telephone Encounter (Signed)
Ok to increase Wellbutrin and decrease lexapro.  Rxs sent to [facility].

## 2013-08-02 NOTE — Telephone Encounter (Signed)
Spoke to pt and informed her Rx have been sent to the pharmacy

## 2013-08-02 NOTE — Telephone Encounter (Signed)
Pt left v/m; pt was seen 07/03/13 started on wellbutrin XL 150 mg taking one daily and pt also takes Lexapro 20 mg daily. Pt loves the wellbutrin and pt request to increase the wellbutrin and decrease lexapro. [facility].

## 2013-12-03 ENCOUNTER — Other Ambulatory Visit: Payer: Self-pay | Admitting: *Deleted

## 2013-12-03 MED ORDER — BUPROPION HCL ER (XL) 300 MG PO TB24
300.0000 mg | ORAL_TABLET | Freq: Every day | ORAL | Status: DC
Start: 2013-12-03 — End: 2014-01-06

## 2014-01-06 ENCOUNTER — Other Ambulatory Visit: Payer: Self-pay | Admitting: *Deleted

## 2014-01-06 NOTE — Telephone Encounter (Signed)
Last office visit 07/03/2013.  Ok to refill? 

## 2014-01-07 MED ORDER — BUPROPION HCL ER (XL) 300 MG PO TB24: 300.0000 mg | ORAL_TABLET | Freq: Every day | ORAL | Status: DC

## 2014-01-21 ENCOUNTER — Ambulatory Visit (INDEPENDENT_AMBULATORY_CARE_PROVIDER_SITE_OTHER): Payer: BC Managed Care – PPO | Admitting: Family Medicine

## 2014-01-21 ENCOUNTER — Encounter: Payer: Self-pay | Admitting: Family Medicine

## 2014-01-21 VITALS — BP 136/70 | HR 62 | Temp 97.7°F | Wt 212.5 lb

## 2014-01-21 DIAGNOSIS — R350 Frequency of micturition: Secondary | ICD-10-CM | POA: Insufficient documentation

## 2014-01-21 DIAGNOSIS — L720 Epidermal cyst: Secondary | ICD-10-CM | POA: Insufficient documentation

## 2014-01-21 MED ORDER — SULFAMETHOXAZOLE-TMP DS 800-160 MG PO TABS
1.0000 | ORAL_TABLET | Freq: Two times a day (BID) | ORAL | Status: DC
Start: 2014-01-21 — End: 2014-01-28

## 2014-01-21 NOTE — Progress Notes (Signed)
   Subjective:   Patient ID: Krystal Mays, female    DOB: [birth year], 48 y.o.   MRN: [id number]  Krystal Mays is a pleasant 48 y.o. year old female who presents to clinic today with Cyst and Back Pain  on 01/21/2014  HPI: 1.  Cyst on back of her neck- was a little larger than a pea when she noticed it a couple of months ago. Over past few days, larger, red, warm and very tender to touch.  Has not been draining.  No fevers or chills. No nausea, vomiting or diarrhea.  2.Increased urinary frequency- ongoing for past few days.  Maybe some mild dysuria and back pain. No hematuria.  Current Outpatient Prescriptions on File Prior to Visit  Medication Sig Dispense Refill  . buPROPion (WELLBUTRIN XL) 300 MG 24 hr tablet Take 1 tablet (300 mg total) by mouth daily.  30 tablet  3  . escitalopram (LEXAPRO) 10 MG tablet Take 1 tablet (10 mg total) by mouth daily.  90 tablet  2  . hydrocortisone-pramoxine (PROCTOFOAM HC) rectal foam Place 1 applicator rectally 2 (two) times daily.  10 g  0  . omeprazole (PRILOSEC) 20 MG capsule Take 2 tablets by mouth daily  84 capsule  5  . silver sulfADIAZINE (SILVADENE) 1 % cream Apply 1 application topically daily.  50 g  0   No current facility-administered medications on file prior to visit.    No Known Allergies  Past Medical History  Diagnosis Date  . Depression   . Genital warts   . ADHD (attention deficit hyperactivity disorder)     Past Surgical History  Procedure Laterality Date  . Endometrial ablation  2008    Family History  Problem Relation Age of Onset  . Cancer Mother 6442    breast   . Cancer Other     breast    History   Social History  . Marital Status: Married    Spouse Name: N/A    Number of Children: 2  . 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 Smoking status: Former Games developermoker  . Smokeless tobacco: Not on file  . Alcohol Use: Yes  . Drug Use: Not on  file  . Sexual Activity: Not on file   Other Topics Concern  . Not on file   Social History Narrative  . No narrative on file   The PMH, PSH, Social History, Family History, Medications, and allergies [facility], and have been updated if relevant.   Review of Systems    See HPI No HA No nausea or vomiting No rashes No abdominal pain No fever No chills Objective:    BP 136/70  Pulse 62  Temp(Src) 97.7 F (36.5 C) (Oral)  Wt 212 lb 8 oz (96.389 kg)  SpO2 97%   Physical Exam  Gen:  Alert, pleasant, NAD HEENT:  Back of neck (midline)- quarter sized, subcutaneous firm mass, warm and red, TTP, non fluctuant, does have some surrounding erythema as well No open areas  Abd:   Soft, NT, No suprapubic tenderness MSK:  No CVS tenderness Ext:  No edema Psych:  Good eye contact, not anxious or depressed appearing      Assessment & Plan:   Epidermoid cyst of neck  Increased urinary frequency No Follow-up on file.

## 2014-01-21 NOTE — Assessment & Plan Note (Signed)
New- now infected with cellulitis. Could be abscess but less likely given history. Treat with bactrim DS- 1 tab twice daily x 7 days. She will call me with an update. Once infection resolves, will send to surgeon for removal. The patient indicates understanding of these issues and agrees with the plan.

## 2014-01-21 NOTE — Patient Instructions (Signed)
Great to see you. Please take Bactrim as directed, apply warm compresses several times per day. Call me with an update in the next 48 hours, sooner if your symptoms worsen or you develop a fever.

## 2014-01-21 NOTE — Progress Notes (Signed)
Pre visit review using our clinic review tool, if applicable. No additional management support is needed unless otherwise documented below in the visit note. 

## 2014-01-21 NOTE — Assessment & Plan Note (Signed)
New- UA neg. She is starting bactrim however for infected cyst- see above.

## 2014-01-27 ENCOUNTER — Ambulatory Visit: Payer: BC Managed Care – PPO | Admitting: Family Medicine

## 2014-01-28 ENCOUNTER — Encounter: Payer: Self-pay | Admitting: Family Medicine

## 2014-01-28 ENCOUNTER — Ambulatory Visit (INDEPENDENT_AMBULATORY_CARE_PROVIDER_SITE_OTHER): Payer: BC Managed Care – PPO | Admitting: Family Medicine

## 2014-01-28 VITALS — BP 124/66 | HR 63 | Temp 97.7°F | Wt 212.0 lb

## 2014-01-28 DIAGNOSIS — F32A Depression, unspecified: Secondary | ICD-10-CM

## 2014-01-28 DIAGNOSIS — L720 Epidermal cyst: Secondary | ICD-10-CM

## 2014-01-28 DIAGNOSIS — F988 Other specified behavioral and emotional disorders with onset usually occurring in childhood and adolescence: Secondary | ICD-10-CM

## 2014-01-28 DIAGNOSIS — F329 Major depressive disorder, single episode, unspecified: Secondary | ICD-10-CM

## 2014-01-28 DIAGNOSIS — F909 Attention-deficit hyperactivity disorder, unspecified type: Secondary | ICD-10-CM

## 2014-01-28 MED ORDER — SULFAMETHOXAZOLE-TMP DS 800-160 MG PO TABS: 1.0000 | ORAL_TABLET | Freq: Two times a day (BID) | ORAL | Status: DC

## 2014-01-28 MED ORDER — DEXMETHYLPHENIDATE HCL ER 15 MG PO CP24: 15.0000 mg | ORAL_CAPSULE | Freq: Every day | ORAL | Status: DC

## 2014-01-28 MED ORDER — BUPROPION HCL ER (XL) 300 MG PO TB24: 300.0000 mg | ORAL_TABLET | Freq: Every day | ORAL | Status: DC

## 2014-01-28 NOTE — Assessment & Plan Note (Signed)
Vs abscess. Improving.  Remains non fluctuant. Will extend course of bactrim for 5 more days- follow up after that point.

## 2014-01-28 NOTE — Progress Notes (Signed)
Pre visit review using our clinic review tool, if applicable. No additional management support is needed unless otherwise documented below in the visit note. 

## 2014-01-28 NOTE — Patient Instructions (Signed)
Great to see you. Please continue with 5 more days of Bactrim. Come see me next week.

## 2014-01-28 NOTE — Progress Notes (Signed)
   Subjective:   Patient ID: Krystal Mays, female    DOB: 1965/04/20, 48 y.o.   MRN: [id number]  Krystal Mays is a pleasant 48 y.o. year old female who presents to clinic today with Follow-up  on 01/28/2014  HPI: 1.  Cyst on back of her neck- just finished course of bactrim. Still a little red, less painful and it is smaller. No fevers.  No nausea, vomiting or diarrhea.  2. ADD- would like to talk about rx. Wants to try focalin as this has worked better for her children.  Feels other stimulants have made her too anxious.  Current Outpatient Prescriptions on File Prior to Visit  Medication Sig Dispense Refill  . buPROPion (WELLBUTRIN XL) 300 MG 24 hr tablet Take 1 tablet (300 mg total) by mouth daily.  30 tablet  3  . escitalopram (LEXAPRO) 10 MG tablet Take 1 tablet (10 mg total) by mouth daily.  90 tablet  2  . hydrocortisone-pramoxine (PROCTOFOAM HC) rectal foam Place 1 applicator rectally 2 (two) times daily.  10 g  0  . omeprazole (PRILOSEC) 20 MG capsule Take 2 tablets by mouth daily  84 capsule  5  . silver sulfADIAZINE (SILVADENE) 1 % cream Apply 1 application topically daily.  50 g  0  . sulfamethoxazole-trimethoprim (BACTRIM DS) 800-160 MG per tablet Take 1 tablet by mouth 2 (two) times daily.  14 tablet  0   No current facility-administered medications on file prior to visit.    No Known Allergies  Past Medical History  Diagnosis Date  . Depression   . Genital warts   . ADHD (attention deficit hyperactivity disorder)     Past Surgical History  Procedure Laterality Date  . Endometrial ablation  2008    Family History  Problem Relation Age of Onset  . Cancer Mother 4842    breast   . Cancer Other     breast    History   Social History  . Marital Status: Married    Spouse Name: N/A    Number of Children: 2  . 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 Smoking status: Former Games developermoker  .  Smokeless tobacco: Not on file  . Alcohol Use: Yes  . Drug Use: Not on file  . Sexual Activity: Not on file   Other Topics Concern  . Not on file   Social History Narrative  . No narrative on file   The PMH, PSH, Social History, Family History, Medications, and allergies [facility], and have been updated if relevant.   Review of Systems    See HPI No HA No nausea or vomiting No rashes No abdominal pain No fever No chills Objective:    BP 124/66  Pulse 63  Temp(Src) 97.7 F (36.5 C) (Oral)  Wt 212 lb (96.163 kg)  SpO2 97%   Physical Exam  Gen:  Alert, pleasant, NAD HEENT:  Back of neck (midline)- dime sized, subcutaneous firm mass, warm and red, TTP, non fluctuant, does have some surrounding erythema as well although improved Abd:   Soft, NT, No suprapubic tenderness MSK:  No CVS tenderness Ext:  No edema Psych:  Good eye contact, not anxious or depressed appearing      Assessment & Plan:   Epidermoid cyst of neck No Follow-up on file.

## 2014-01-28 NOTE — Assessment & Plan Note (Signed)
Deteriorated. >25 minutes spent in face to face time with patient, >50% spent in counselling or coordination of care rx given for focalin given that she has been intolerant to other stimulants. Call or return to clinic prn if these symptoms worsen or fail to improve as anticipated. The patient indicates understanding of these issues and agrees with the plan.

## 2014-02-03 ENCOUNTER — Encounter: Payer: Self-pay | Admitting: Family Medicine

## 2014-02-03 ENCOUNTER — Encounter (INDEPENDENT_AMBULATORY_CARE_PROVIDER_SITE_OTHER): Payer: BC Managed Care – PPO | Admitting: Family Medicine

## 2014-02-03 VITALS — BP 118/72 | HR 62 | Temp 97.9°F | Wt 213.2 lb

## 2014-02-03 DIAGNOSIS — Z23 Encounter for immunization: Secondary | ICD-10-CM

## 2014-02-03 NOTE — Progress Notes (Signed)
Pre visit review using our clinic review tool, if applicable. No additional management support is needed unless otherwise documented below in the visit note. 

## 2014-02-25 ENCOUNTER — Emergency Department: Payer: Self-pay | Admitting: Emergency Medicine

## 2014-02-25 LAB — COMPREHENSIVE METABOLIC PANEL
ALK PHOS: 76 U/L
AST: 10 U/L — AB (ref 15–37)
Albumin: 3.9 g/dL (ref 3.4–5.0)
Anion Gap: 8 (ref 7–16)
BUN: 18 mg/dL (ref 7–18)
Bilirubin,Total: 0.5 mg/dL (ref 0.2–1.0)
CHLORIDE: 107 mmol/L (ref 98–107)
Calcium, Total: 8.3 mg/dL — ABNORMAL LOW (ref 8.5–10.1)
Co2: 27 mmol/L (ref 21–32)
Creatinine: 0.92 mg/dL (ref 0.60–1.30)
EGFR (Non-African Amer.): >60
Glucose: 89 mg/dL (ref 65–99)
Osmolality: 284 (ref 275–301)
POTASSIUM: 3.7 mmol/L (ref 3.5–5.1)
SGPT (ALT): 15 U/L
Sodium: 142 mmol/L (ref 136–145)
TOTAL PROTEIN: 7 g/dL (ref 6.4–8.2)

## 2014-02-25 LAB — CBC WITH DIFFERENTIAL/PLATELET
BASOS ABS: 0 10*3/uL (ref 0.0–0.1)
Basophil %: 0.6 %
EOS PCT: 1.2 %
Eosinophil #: 0.1 10*3/uL (ref 0.0–0.7)
HCT: 38.6 % (ref 35.0–47.0)
HGB: 13.4 g/dL (ref 12.0–16.0)
LYMPHS ABS: 2.1 10*3/uL (ref 1.0–3.6)
Lymphocyte %: 27.2 %
MCH: 32.5 pg (ref 26.0–34.0)
MCHC: 34.8 g/dL (ref 32.0–36.0)
MCV: 93 fL (ref 80–100)
MONO ABS: 0.6 x10 3/mm (ref 0.2–0.9)
Monocyte %: 8.2 %
NEUTROS ABS: 4.9 10*3/uL (ref 1.4–6.5)
NEUTROS PCT: 62.8 %
Platelet: 177 10*3/uL (ref 150–440)
RBC: 4.13 10*6/uL (ref 3.80–5.20)
RDW: 13 % (ref 11.5–14.5)
WBC: 7.8 10*3/uL (ref 3.6–11.0)

## 2014-02-25 LAB — URINALYSIS, COMPLETE
Bacteria: NONE SEEN
Bilirubin,UR: NEGATIVE
Blood: NEGATIVE
Glucose,UR: NEGATIVE mg/dL (ref 0–75)
LEUKOCYTE ESTERASE: NEGATIVE
Nitrite: NEGATIVE
Ph: 7 (ref 4.5–8.0)
Protein: NEGATIVE
Specific Gravity: 1.021 (ref 1.003–1.030)
WBC UR: 1 /HPF (ref 0–5)

## 2014-02-25 LAB — LIPASE, BLOOD: Lipase: 118 U/L (ref 73–393)

## 2014-02-25 LAB — TROPONIN I

## 2014-03-04 ENCOUNTER — Ambulatory Visit (INDEPENDENT_AMBULATORY_CARE_PROVIDER_SITE_OTHER): Payer: BC Managed Care – PPO | Admitting: Family Medicine

## 2014-03-04 VITALS — BP 124/62 | HR 54 | Temp 97.6°F | Wt 212.8 lb

## 2014-03-04 DIAGNOSIS — R109 Unspecified abdominal pain: Secondary | ICD-10-CM | POA: Insufficient documentation

## 2014-03-04 DIAGNOSIS — K6389 Other specified diseases of intestine: Secondary | ICD-10-CM

## 2014-03-04 DIAGNOSIS — R1033 Periumbilical pain: Secondary | ICD-10-CM

## 2014-03-04 DIAGNOSIS — K529 Noninfective gastroenteritis and colitis, unspecified: Secondary | ICD-10-CM

## 2014-03-04 MED ORDER — IBUPROFEN 800 MG PO TABS: 800.0000 mg | ORAL_TABLET | Freq: Three times a day (TID) | ORAL | Status: DC | PRN

## 2014-03-04 NOTE — Assessment & Plan Note (Signed)
New- discussed typical course and self resolving nature of this process.  Since she is so nauseated with Flagyl, we both agreed that she should stop taking this as no signs of infection/colitis at this point. Call or return to clinic prn if these symptoms worsen or fail to improve as anticipated. The patient indicates understanding of these issues and agrees with the plan.

## 2014-03-04 NOTE — Assessment & Plan Note (Signed)
Resolved

## 2014-03-04 NOTE — Patient Instructions (Signed)
Good to see you. Please keep me updated with your symptoms.

## 2014-03-04 NOTE — Progress Notes (Signed)
Pre visit review using our clinic review tool, if applicable. No additional management support is needed unless otherwise documented below in the visit note. 

## 2014-03-04 NOTE — Progress Notes (Signed)
Subjective:   Patient ID: Krystal MarrowKristi T Mays, female    DOB: [birth year], 48 y.o.   MRN: [id number]  Krystal Mays is a pleasant 48 y.o. year old female who presents to clinic today with Hospitalization Follow-up  on 03/04/2014  HPI: [facility]. Presented to ER on 11/10 (7 days ago) for 4 day history of left sided flank pain, periumbilical pain. No diarrhea, vomiting, or black stools.  Troponin neg CBC wnl, CMET unremarkable, Lipase wnl, UA neg.  CT of abdomen/Pelvis without contrast showed:  Abnormal focal ovoid multilaminar ring like stranding of mesenteric fat just anterior to descending colon, measuring 3.5 x 3 cm.  Most consistent with focal panniculitis, appendagitis or focal mesenteric ischemia.  Less likely diverticulitis or colitis.  No nephrolithiasis, normal appendix.  Given rx for cipro and flagyl, advised supportive care- given Ibuprofen 800 mg twice daily- three times daily prn abdominal pain.  Also given rx percocet which she is not taking anymore.  Abdominal pain has almost completely resolved but she is nauseated after she takes flagyl.  Current Outpatient Prescriptions on File Prior to Visit  Medication Sig Dispense Refill  . buPROPion (WELLBUTRIN XL) 300 MG 24 hr tablet Take 1 tablet (300 mg total) by mouth daily. 90 tablet 3  . dexmethylphenidate (FOCALIN XR) 15 MG 24 hr capsule Take 1 capsule (15 mg total) by mouth daily. 30 capsule 0  . escitalopram (LEXAPRO) 10 MG tablet Take 1 tablet (10 mg total) by mouth daily. 90 tablet 2  . hydrocortisone-pramoxine (PROCTOFOAM HC) rectal foam Place 1 applicator rectally 2 (two) times daily. 10 g 0  . omeprazole (PRILOSEC) 20 MG capsule Take 2 tablets by mouth daily 84 capsule 5  . silver sulfADIAZINE (SILVADENE) 1 % cream Apply 1 application topically daily. 50 g 0   No current facility-administered medications on file prior to visit.    No Known Allergies  Past Medical History  Diagnosis Date  .  Depression   . Genital warts   . ADHD (attention deficit hyperactivity disorder)     Past Surgical History  Procedure Laterality Date  . Endometrial ablation  2008    Family History  Problem Relation Age of Onset  . Cancer Mother 9042    breast   . Cancer Other     breast    History   Social History  . Marital Status: Married    Spouse Name: N/A    Number of Children: 2  . 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 Smoking status: Former Games developermoker  . Smokeless tobacco: Not on file  . Alcohol Use: Yes  . Drug Use: Not on file  . Sexual Activity: Not on file   Other Topics Concern  . Not on file   Social History Narrative  . No narrative on file   The PMH, PSH, Social History, Family History, Medications, and allergies [facility], and have been updated if relevant.  Review of Systems    See HPI No fevers No black stools Mild constipation- taking colace No hematuria No back pain Objective:    BP 124/62 mmHg  Pulse 54  Temp(Src) 97.6 F (36.4 C) (Oral)  Wt 212 lb 12 oz (96.503 kg)  SpO2 98%   Physical Exam  Constitutional: She is oriented to person, place, and time. She appears well-developed and well-nourished. No distress.  Abdominal: Soft. Bowel  sounds are normal. She exhibits no distension and no mass. There is no tenderness. There is no rebound and no guarding.  Neurological: She is alert and oriented to person, place, and time.  Skin: Skin is warm and dry.  Psychiatric: She has a normal mood and affect. Her behavior is normal. Judgment and thought content normal.  Nursing note and vitals reviewed.         Assessment & Plan:   Periumbilical abdominal pain  Epiploic appendagitis No Follow-up on file.

## 2014-04-09 ENCOUNTER — Other Ambulatory Visit: Payer: Self-pay | Admitting: *Deleted

## 2014-04-09 MED ORDER — ESCITALOPRAM OXALATE 10 MG PO TABS: 10.0000 mg | ORAL_TABLET | Freq: Every day | ORAL | Status: DC

## 2014-04-09 MED ORDER — OMEPRAZOLE 20 MG PO CPDR: DELAYED_RELEASE_CAPSULE | ORAL | Status: DC

## 2014-05-20 ENCOUNTER — Encounter: Payer: Self-pay | Admitting: Family Medicine

## 2014-05-21 ENCOUNTER — Ambulatory Visit (INDEPENDENT_AMBULATORY_CARE_PROVIDER_SITE_OTHER): Payer: BLUE CROSS/BLUE SHIELD | Admitting: Family Medicine

## 2014-05-21 ENCOUNTER — Encounter: Payer: Self-pay | Admitting: Family Medicine

## 2014-05-21 ENCOUNTER — Other Ambulatory Visit (HOSPITAL_COMMUNITY)
Admission: RE | Admit: 2014-05-21 | Discharge: 2014-05-21 | Disposition: A | Discharge status: Home / Self Care | Payer: BLUE CROSS/BLUE SHIELD | Source: Ambulatory Visit | Attending: Family Medicine | Admitting: Family Medicine

## 2014-05-21 VITALS — BP 132/74 | HR 61 | Temp 97.7°F | Wt 211.2 lb

## 2014-05-21 DIAGNOSIS — Z01419 Encounter for gynecological examination (general) (routine) without abnormal findings: Secondary | ICD-10-CM | POA: Insufficient documentation

## 2014-05-21 DIAGNOSIS — Z1151 Encounter for screening for human papillomavirus (HPV): Secondary | ICD-10-CM | POA: Insufficient documentation

## 2014-05-21 DIAGNOSIS — Z113 Encounter for screening for infections with a predominantly sexual mode of transmission: Secondary | ICD-10-CM | POA: Diagnosis present

## 2014-05-21 DIAGNOSIS — N760 Acute vaginitis: Secondary | ICD-10-CM | POA: Insufficient documentation

## 2014-05-21 DIAGNOSIS — R102 Pelvic and perineal pain: Secondary | ICD-10-CM

## 2014-05-21 DIAGNOSIS — R109 Unspecified abdominal pain: Secondary | ICD-10-CM

## 2014-05-21 LAB — POCT URINALYSIS DIPSTICK
Bilirubin, UA: NEGATIVE
Blood, UA: NEGATIVE
Glucose, UA: NEGATIVE
Ketones, UA: NEGATIVE
LEUKOCYTES UA: NEGATIVE
NITRITE UA: NEGATIVE
Protein, UA: NEGATIVE
SPEC GRAV UA: 1.02
UROBILINOGEN UA: 0.2
pH, UA: 7.5

## 2014-05-21 MED ORDER — DOXYCYCLINE HYCLATE 100 MG PO TABS: 100.0000 mg | ORAL_TABLET | Freq: Two times a day (BID) | ORAL | Status: DC

## 2014-05-21 NOTE — Assessment & Plan Note (Signed)
New- exam findings concerning for PID. UA neg. She declined urine pregnancy- s/p ablation. Cervical swab- GC/Chlamydia, BV, trichomonas, yeast done. Start on Doxycyline 100 mg twice daily x 10 days empirically. Discussed red flag symptoms requiring urgent evaluation including fevers, nausea, vomiting. The patient indicates understanding of these issues and agrees with the plan.

## 2014-05-21 NOTE — Patient Instructions (Signed)
Good to see you. Please take doxycycline as directed- 1 tablet twice daily x 10 days.  We will call you with your results.  Pelvic Inflammatory Disease Pelvic inflammatory disease (PID) refers to an infection in some or all of the female organs. The infection can be in the uterus, ovaries, fallopian tubes, or the surrounding tissues in the pelvis. PID can cause abdominal or pelvic pain that comes on suddenly (acute pelvic pain). PID is a serious infection because it can lead to lasting (chronic) pelvic pain or the inability to have children (infertile).  CAUSES  The infection is often caused by the normal bacteria found in the vaginal tissues. PID may also be caused by an infection that is spread during sexual contact. PID can also occur following:   The birth of a baby.   A miscarriage.   An abortion.   Major pelvic surgery.   The use of an intrauterine device (IUD).   A sexual assault.  RISK FACTORS Certain factors can put a person at higher risk for PID, such as:  Being younger than 25 years.  Being sexually active at [place] age.  Usingnonbarrier contraception.  Havingmultiple sexual partners.  Having sex with someone who has symptoms of a genital infection.  Using oral contraception. Other times, certain behaviors can increase the possibility of getting PID, such as:  Having sex during your period.  Using a vaginal douche.  Having an intrauterine device (IUD) in place. SYMPTOMS   Abdominal or pelvic pain.   Fever.   Chills.   Abnormal vaginal discharge.  Abnormal uterine bleeding.   Unusual pain shortly after finishing your period. DIAGNOSIS  Your caregiver will choose some of the following methods to make a diagnosis, such as:   Performinga physical exam and history. A pelvic exam typically reveals a very tender uterus and surrounding pelvis.   Ordering laboratory tests including a pregnancy test, blood tests, and urine  test.  Orderingcultures of the vagina and cervix to check for a sexually transmitted infection (STI).  Performing an ultrasound.   Performing a laparoscopic procedure to look inside the pelvis.  TREATMENT   Antibiotic medicines may be prescribed and taken by mouth.   Sexual partners may be treated when the infection is caused by a sexually transmitted disease (STD).   Hospitalization may be needed to give antibiotics intravenously.  Surgery may be needed, but this is rare. It may take weeks until you are completely well. If you are diagnosed with PID, you should also be checked for human immunodeficiency virus (HIV). HOME CARE INSTRUCTIONS   If given, take your antibiotics as directed. Finish the medicine even if you start to feel better.   Only take over-the-counter or prescription medicines for pain, discomfort, or fever as directed by your caregiver.   Do not have sexual intercourse until treatment is completed or as directed by your caregiver. If PID is confirmed, your recent sexual partner(s) will need treatment.   Keep your follow-up appointments. SEEK MEDICAL CARE IF:   You have increased or abnormal vaginal discharge.   You need prescription medicine for your pain.   You vomit.   You cannot take your medicines.   Your partner has an STD.  SEEK IMMEDIATE MEDICAL CARE IF:   You have a fever.   You have increased abdominal or pelvic pain.   You have chills.   You have pain when you urinate.   You are not better after 72 hours following treatment.  MAKE SURE YOU:  Understand these instructions.  Will watch your condition.  Will get help right away if you are not doing well or get worse. Document Released: 04/04/2005 Document Revised: 07/30/2012 Document Reviewed: 03/31/2011 [facility] Patient Information 2015 [place]. This information is not intended to replace advice given to you by your health care provider. Make sure you  discuss any questions you have with your health care provider.

## 2014-05-21 NOTE — Progress Notes (Signed)
Subjective:   Patient ID: Krystal Mays, female    DOB: December 06, 1965, 49 y.o.   MRN: [id number]  Krystal Mays is a pleasant 49 y.o. year old female who presents to clinic today with Back Pain; Flank Pain; and Vaginal Discharge  on 05/21/2014  HPI: Last week acute onset of left flank pain.  No dysuria.  Then over past couple of days, that symptom has resolved but is now having pelvic "fullness" and vaginal discharge. Had chills last night. No fever. No abnormal bleeding- remote h/o ablation. Sexually active with her husband only.  Last pap smear- 01/31/12- done by me- neg.  Current Outpatient Prescriptions on File Prior to Visit  Medication Sig Dispense Refill  . buPROPion (WELLBUTRIN XL) 300 MG 24 hr tablet Take 1 tablet (300 mg total) by mouth daily. 90 tablet 3  . dexmethylphenidate (FOCALIN XR) 15 MG 24 hr capsule Take 1 capsule (15 mg total) by mouth daily. 30 capsule 0  . docusate sodium (COLACE) 100 MG capsule Take 100 mg by mouth 2 (two) times daily.    Marland Kitchen. escitalopram (LEXAPRO) 10 MG tablet Take 1 tablet (10 mg total) by mouth daily. 90 tablet 0  . hydrocortisone-pramoxine (PROCTOFOAM HC) rectal foam Place 1 applicator rectally 2 (two) times daily. 10 g 0  . ibuprofen (ADVIL,MOTRIN) 800 MG tablet Take 1 tablet (800 mg total) by mouth every 8 (eight) hours as needed. 30 tablet 0  . omeprazole (PRILOSEC) 20 MG capsule Take 2 tablets by mouth daily 60 capsule 5  . oxyCODONE-acetaminophen (PERCOCET/ROXICET) 5-325 MG per tablet Take 1 tablet by mouth every 6 (six) hours as needed for severe pain.    . silver sulfADIAZINE (SILVADENE) 1 % cream Apply 1 application topically daily. 50 g 0   No current facility-administered medications on file prior to visit.    Allergies  Allergen Reactions  . Flagyl [Metronidazole] Nausea Only    Past Medical History  Diagnosis Date  . Depression   . Genital warts   . ADHD (attention deficit hyperactivity disorder)     Past Surgical  History  Procedure Laterality Date  . Endometrial ablation  2008    Family History  Problem Relation Age of Onset  . Cancer Mother [id number]    breast   . Cancer Other     breast    History   Social History  . Marital Status: Married    Spouse Name: N/A    Number of Children: 2  . 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 Smoking status: Former Games developermoker  . Smokeless tobacco: Not on file  . Alcohol Use: Yes  . Drug Use: Not on file  . Sexual Activity: Not on file   Other Topics Concern  . Not on file   Social History Narrative   The PMH, PSH, Social History, Family History, Medications, and allergies have been reviewed in [facility], and have been updated if relevant.   Review of Systems  Constitutional: Positive for chills and fatigue. Negative for fever.  Gastrointestinal: Positive for nausea and abdominal distention. Negative for vomiting, abdominal pain, diarrhea, constipation, blood in stool, anal bleeding and rectal pain.  Genitourinary: Positive for flank pain, vaginal discharge and pelvic pain. Negative for dysuria, urgency, frequency, hematuria, decreased urine volume, vaginal bleeding, enuresis, difficulty urinating, vaginal pain, menstrual problem and dyspareunia.  Musculoskeletal: Negative.   Neurological: Negative.   Psychiatric/Behavioral: Negative.  All other systems reviewed and are negative.      Objective:    BP 132/74 mmHg  Pulse 61  Temp(Src) 97.7 F (36.5 C) (Oral)  Wt 211 lb 4 oz (95.822 kg)  SpO2 97%   Physical Exam  Constitutional: She is oriented to person, place, and time. She appears well-developed and well-nourished. No distress.  HENT:  Head: Normocephalic.  Eyes: Conjunctivae are normal.  Neck: Normal range of motion.  Cardiovascular: Normal rate.   Pulmonary/Chest: Effort normal.  Abdominal: Soft. Bowel sounds are normal. She exhibits no mass. There is tenderness. There is no  rebound and no guarding.  No flank pain  Genitourinary: Pelvic exam was performed with patient prone. There is no rash, tenderness or lesion on the right labia. There is no rash, tenderness or lesion on the left labia. Cervix exhibits motion tenderness and discharge. Right adnexum displays no mass. Left adnexum displays no mass. Vaginal discharge found.  Musculoskeletal: Normal range of motion. She exhibits no edema.  Neurological: She is alert and oriented to person, place, and time. No cranial nerve deficit.  Skin: Skin is warm and dry.  Psychiatric: She has a normal mood and affect. Her behavior is normal. Judgment and thought content normal.  Nursing note and vitals reviewed.         Assessment & Plan:   Flank pain - Plan: Urinalysis Dipstick  Encounter for routine gynecological examination - Plan: Cytology - PAP [place]  Pelvic pain in female No Follow-up on file.

## 2014-05-21 NOTE — Assessment & Plan Note (Signed)
Pap smear done today

## 2014-05-21 NOTE — Progress Notes (Signed)
Pre visit review using our clinic review tool, if applicable. No additional management support is needed unless otherwise documented below in the visit note. 

## 2014-05-22 ENCOUNTER — Telehealth: Payer: Self-pay | Admitting: Family Medicine

## 2014-05-22 ENCOUNTER — Ambulatory Visit: Payer: Self-pay | Admitting: Family Medicine

## 2014-05-22 MED ORDER — IBUPROFEN 800 MG PO TABS: 800.0000 mg | ORAL_TABLET | Freq: Three times a day (TID) | ORAL | Status: DC | PRN

## 2014-05-22 NOTE — Addendum Note (Signed)
Addended by: Dianne DunARON, Rand Boller M on: 05/22/2014 08:24 AM   Modules accepted: Orders

## 2014-05-22 NOTE — Telephone Encounter (Signed)
Patient was seen earlier and she asked for [facility] to call her back.  She would only speak with [place].

## 2014-05-22 NOTE — Telephone Encounter (Signed)
Spoke to pt who states that she was not feeing any better and wanted to know if she was needing to f/u with Dr Dayton MartesAron. I advised pt to await results so that it would give Dr Dayton MartesAron a better direction of what may need to occur next. Pt verbally expressed understanding and was advised I would contact her upon their resulting

## 2014-05-23 LAB — CYTOLOGY - PAP

## 2014-05-23 LAB — CERVICOVAGINAL ANCILLARY ONLY
Bacterial vaginitis: NEGATIVE
CANDIDA VAGINITIS: NEGATIVE

## 2014-05-27 LAB — CERVICOVAGINAL ANCILLARY ONLY: Herpes: NEGATIVE

## 2014-07-08 ENCOUNTER — Other Ambulatory Visit: Payer: Self-pay | Admitting: *Deleted

## 2014-07-08 MED ORDER — ESCITALOPRAM OXALATE 10 MG PO TABS: 10.0000 mg | ORAL_TABLET | Freq: Every day | ORAL | Status: DC

## 2014-12-01 ENCOUNTER — Other Ambulatory Visit: Payer: Self-pay

## 2014-12-01 MED ORDER — DEXMETHYLPHENIDATE HCL ER 15 MG PO CP24: 15.0000 mg | ORAL_CAPSULE | Freq: Every day | ORAL | Status: DC

## 2014-12-01 NOTE — Telephone Encounter (Signed)
Lm on pts vm informing her Rx is available for pickup from the front desk 

## 2014-12-01 NOTE — Telephone Encounter (Signed)
Pt left v/m requesting rx focalin xr. Call when ready for pick up. Pt last seen for f/u on 03/04/14. rx last printed # 30 on 01/28/14.Please advise.

## 2015-01-26 ENCOUNTER — Ambulatory Visit (INDEPENDENT_AMBULATORY_CARE_PROVIDER_SITE_OTHER): Payer: BLUE CROSS/BLUE SHIELD

## 2015-01-26 DIAGNOSIS — Z23 Encounter for immunization: Secondary | ICD-10-CM | POA: Diagnosis not present

## 2015-02-09 ENCOUNTER — Other Ambulatory Visit: Payer: Self-pay | Admitting: Family Medicine

## 2015-02-09 NOTE — Telephone Encounter (Signed)
Spoke to pt and informed her OV required. Depression last addressed 01/2014. Pt advised #30 sent to pharmacy and will contact office back to schedule appt; advised no additional refills until seen

## 2015-03-11 ENCOUNTER — Other Ambulatory Visit: Payer: Self-pay | Admitting: Family Medicine

## 2015-03-23 ENCOUNTER — Other Ambulatory Visit: Payer: Self-pay | Admitting: Family Medicine

## 2015-03-23 ENCOUNTER — Encounter: Payer: Self-pay | Admitting: Family Medicine

## 2015-03-23 DIAGNOSIS — Z1231 Encounter for screening mammogram for malignant neoplasm of breast: Secondary | ICD-10-CM

## 2015-03-23 MED ORDER — BUPROPION HCL ER (XL) 300 MG PO TB24: 300.0000 mg | ORAL_TABLET | Freq: Every day | ORAL | Status: DC

## 2015-03-26 ENCOUNTER — Ambulatory Visit
Admission: RE | Admit: 2015-03-26 | Discharge: 2015-03-26 | Disposition: A | Discharge status: Home / Self Care | Payer: BLUE CROSS/BLUE SHIELD | Source: Ambulatory Visit | Attending: Family Medicine | Admitting: Family Medicine

## 2015-03-26 DIAGNOSIS — Z1231 Encounter for screening mammogram for malignant neoplasm of breast: Secondary | ICD-10-CM | POA: Diagnosis present

## 2015-04-07 ENCOUNTER — Encounter: Payer: Self-pay | Admitting: Family Medicine

## 2015-04-07 ENCOUNTER — Ambulatory Visit (INDEPENDENT_AMBULATORY_CARE_PROVIDER_SITE_OTHER): Payer: BLUE CROSS/BLUE SHIELD | Admitting: Family Medicine

## 2015-04-07 VITALS — BP 100/64 | HR 70 | Temp 98.1°F | Ht 66.0 in | Wt 218.5 lb

## 2015-04-07 DIAGNOSIS — Z0000 Encounter for general adult medical examination without abnormal findings: Secondary | ICD-10-CM | POA: Diagnosis not present

## 2015-04-07 DIAGNOSIS — F909 Attention-deficit hyperactivity disorder, unspecified type: Secondary | ICD-10-CM

## 2015-04-07 DIAGNOSIS — F988 Other specified behavioral and emotional disorders with onset usually occurring in childhood and adolescence: Secondary | ICD-10-CM

## 2015-04-07 DIAGNOSIS — F32A Depression, unspecified: Secondary | ICD-10-CM

## 2015-04-07 DIAGNOSIS — Z01419 Encounter for gynecological examination (general) (routine) without abnormal findings: Secondary | ICD-10-CM | POA: Insufficient documentation

## 2015-04-07 DIAGNOSIS — F329 Major depressive disorder, single episode, unspecified: Secondary | ICD-10-CM | POA: Diagnosis not present

## 2015-04-07 LAB — CBC WITH DIFFERENTIAL/PLATELET
BASOS ABS: 0 10*3/uL (ref 0.0–0.1)
Basophils Relative: 0.8 % (ref 0.0–3.0)
Eosinophils Absolute: 0.1 10*3/uL (ref 0.0–0.7)
Eosinophils Relative: 1.8 % (ref 0.0–5.0)
HCT: 43 % (ref 36.0–46.0)
HEMOGLOBIN: 14.6 g/dL (ref 12.0–15.0)
LYMPHS ABS: 1.8 10*3/uL (ref 0.7–4.0)
Lymphocytes Relative: 28.3 % (ref 12.0–46.0)
MCHC: 33.9 g/dL (ref 30.0–36.0)
MCV: 94.1 fl (ref 78.0–100.0)
MONO ABS: 0.4 10*3/uL (ref 0.1–1.0)
Monocytes Relative: 7.1 % (ref 3.0–12.0)
NEUTROS PCT: 62 % (ref 43.0–77.0)
Neutro Abs: 3.9 10*3/uL (ref 1.4–7.7)
Platelets: 168 10*3/uL (ref 150.0–400.0)
RBC: 4.57 Mil/uL (ref 3.87–5.11)
RDW: 12.9 % (ref 11.5–15.5)
WBC: 6.2 10*3/uL (ref 4.0–10.5)

## 2015-04-07 LAB — COMPREHENSIVE METABOLIC PANEL
ALBUMIN: 4.5 g/dL (ref 3.5–5.2)
ALK PHOS: 51 U/L (ref 39–117)
ALT: 18 U/L (ref 0–35)
AST: 18 U/L (ref 0–37)
BILIRUBIN TOTAL: 0.7 mg/dL (ref 0.2–1.2)
BUN: 15 mg/dL (ref 6–23)
CO2: 25 mEq/L (ref 19–32)
CREATININE: 1.03 mg/dL (ref 0.40–1.20)
Calcium: 9.4 mg/dL (ref 8.4–10.5)
Chloride: 106 mEq/L (ref 96–112)
GFR: 60.77 mL/min (ref >60.00)
GLUCOSE: 105 mg/dL — AB (ref 70–99)
Potassium: 4.1 mEq/L (ref 3.5–5.1)
SODIUM: 139 meq/L (ref 135–145)
TOTAL PROTEIN: 6.8 g/dL (ref 6.0–8.3)

## 2015-04-07 LAB — LIPID PANEL
CHOL/HDL RATIO: 5
CHOLESTEROL: 200 mg/dL (ref 0–200)
HDL: 43.5 mg/dL (ref >39.00)
LDL CALC: 130 mg/dL — AB (ref 0–99)
NonHDL: 156.24
TRIGLYCERIDES: 131 mg/dL (ref 0.0–149.0)
VLDL: 26.2 mg/dL (ref 0.0–40.0)

## 2015-04-07 LAB — VITAMIN D 25 HYDROXY (VIT D DEFICIENCY, FRACTURES): VITD: 21.41 ng/mL — AB (ref 30.00–100.00)

## 2015-04-07 LAB — TSH: TSH: 1.62 u[IU]/mL (ref 0.35–4.50)

## 2015-04-07 LAB — VITAMIN B12: VITAMIN B 12: 318 pg/mL (ref 211–911)

## 2015-04-07 NOTE — Progress Notes (Signed)
49 yo G2P2 here for CPX and follow up of chronic medical conditions.  No h/o abnormal pap smears. Last pap smear done by me on 05/21/14.  Mammogram 03/26/15  Influenza vaccine 01/26/15  ADD- currently taking Focalin 15 mg daily and Wellbutrin 300 mg XL daily. She feels her symptoms are well controlled.  Depression- Currently taking Lexapro 10 mg daily, previously took Cymbalta. She feels her symptoms of anxiety and depression are very well controlled.  Lab Results  Component Value Date   CHOL 177 01/26/2012   HDL 37.80* 01/26/2012   LDLCALC 109* 01/26/2012   TRIG 149.0 01/26/2012   CHOLHDL 5 01/26/2012    Lab Results  Component Value Date   WBC 7.8 02/25/2014   HGB 13.4 02/25/2014   HCT 38.6 02/25/2014   MCV 93 02/25/2014   PLT 177 02/25/2014   No results found for: TSH Lab Results  Component Value Date   NA 142 02/25/2014   K 3.7 02/25/2014   CL 107 02/25/2014   CO2 27 02/25/2014   Current Outpatient Prescriptions on File Prior to Visit  Medication Sig Dispense Refill  . buPROPion (WELLBUTRIN XL) 300 MG 24 hr tablet Take 1 tablet (300 mg total) by mouth daily. 30 tablet 0  . dexmethylphenidate (FOCALIN XR) 15 MG 24 hr capsule Take 1 capsule (15 mg total) by mouth daily. 30 capsule 0  . docusate sodium (COLACE) 100 MG capsule Take 100 mg by mouth as needed.     Marland Kitchen escitalopram (LEXAPRO) 10 MG tablet TAKE 1 TABLET BY MOUTH ONCE A DAY 30 tablet 0  . hydrocortisone-pramoxine (PROCTOFOAM HC) rectal foam Place 1 applicator rectally 2 (two) times daily. (Patient taking differently: Place 1 applicator rectally 2 (two) times daily as needed. ) 10 g 0  . ibuprofen (ADVIL,MOTRIN) 800 MG tablet Take 1 tablet (800 mg total) by mouth every 8 (eight) hours as needed. 30 tablet 0  . omeprazole (PRILOSEC) 20 MG capsule Take 2 tablets by mouth daily 60 capsule 5   No current facility-administered medications on file prior to visit.    Allergies  Allergen Reactions  . Flagyl  [Metronidazole] Nausea Only    Past Medical History  Diagnosis Date  . Depression   . Genital warts   . ADHD (attention deficit hyperactivity disorder)     Past Surgical History  Procedure Laterality Date  . Endometrial ablation  2008    Family History  Problem Relation Age of Onset  . Cancer Mother 97    breast   . Cancer Other     breast    Social History   Social History  . Marital Status: Married    Spouse Name: N/A  . Number of Children: 2  . 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 Smoking status: Former Games developer  . Smokeless tobacco: Never Used  . Alcohol Use: 0.0 oz/week    0 Standard drinks or equivalent per week  . Drug Use: Not on file  . Sexual Activity: Not on file   Other Topics Concern  . Not on file   Social History Narrative   The PMH, PSH, Social History, Family History, Medications, and allergies have been reviewed in [facility], and have been updated if relevant.    Review of Systems  Constitutional: Positive for fatigue.  Eyes: Negative.   Respiratory: Negative.   Cardiovascular: Negative.   Gastrointestinal: Negative.   Endocrine: Negative.  Genitourinary: Negative.   Musculoskeletal: Negative.   Allergic/Immunologic: Negative.   Neurological: Negative.   Hematological: Negative.   Psychiatric/Behavioral: Negative.   All other systems reviewed and are negative.    Patient Active Problem List   Diagnosis Date Noted  . Well woman exam 04/07/2015  . Pelvic pain in female 05/21/2014  . Encounter for routine gynecological examination 05/21/2014  . Epiploic appendagitis 03/04/2014  . GERD (gastroesophageal reflux disease) 03/04/2011  . Attention deficit disorder 05/24/2010  . Depression 03/25/2010   Past Medical History  Diagnosis Date  . Depression   . Genital warts   . ADHD (attention deficit hyperactivity disorder)    Past Surgical History  Procedure Laterality  Date  . Endometrial ablation  2008   Social History  Substance Use Topics  . Smoking status: Former Games developermoker  . Smokeless tobacco: Never Used  . Alcohol Use: 0.0 oz/week    0 Standard drinks or equivalent per week   Family History  Problem Relation Age of Onset  . Cancer Mother 6142    breast   . Cancer Other     breast   Allergies  Allergen Reactions  . Flagyl [Metronidazole] Nausea Only   Current Outpatient Prescriptions on File Prior to Visit  Medication Sig Dispense Refill  . buPROPion (WELLBUTRIN XL) 300 MG 24 hr tablet Take 1 tablet (300 mg total) by mouth daily. 30 tablet 0  . dexmethylphenidate (FOCALIN XR) 15 MG 24 hr capsule Take 1 capsule (15 mg total) by mouth daily. 30 capsule 0  . docusate sodium (COLACE) 100 MG capsule Take 100 mg by mouth as needed.     Marland Kitchen. escitalopram (LEXAPRO) 10 MG tablet TAKE 1 TABLET BY MOUTH ONCE A DAY 30 tablet 0  . hydrocortisone-pramoxine (PROCTOFOAM HC) rectal foam Place 1 applicator rectally 2 (two) times daily. (Patient taking differently: Place 1 applicator rectally 2 (two) times daily as needed. ) 10 g 0  . ibuprofen (ADVIL,MOTRIN) 800 MG tablet Take 1 tablet (800 mg total) by mouth every 8 (eight) hours as needed. 30 tablet 0  . omeprazole (PRILOSEC) 20 MG capsule Take 2 tablets by mouth daily 60 capsule 5   No current facility-administered medications on file prior to visit.   The PMH, PSH, Social History, Family History, Medications, and allergies [facility], and have been updated if relevant.  Physical Exam BP 100/64 mmHg  Pulse 70  Temp(Src) 98.1 F (36.7 C) (Oral)  Ht 5\' 6"  (1.676 m)  Wt 218 lb 8 oz (99.111 kg)  BMI 35.28 kg/m2  General:  Well-developed,well-nourished,in no acute distress; alert,appropriate and cooperative throughout examination Head:  normocephalic and atraumatic.   Eyes:  vision grossly intact, pupils equal, pupils round, and pupils reactive to light.   Ears:  R ear normal and L ear  normal.   Nose:  no external deformity.   Mouth:  good dentition.   Neck:  No deformities, masses, or tenderness noted. Lungs:  Normal respiratory effort, chest expands symmetrically. Lungs are clear to auscultation, no crackles or wheezes. Heart:  Normal rate and regular rhythm. S1 and S2 normal without gallop, murmur, click, rub or other extra sounds. Abdomen:  Bowel sounds positive,abdomen soft and non-tender without masses, organomegaly or hernias noted. Msk:  No deformity or scoliosis noted of thoracic or lumbar spine.   Extremities:  No clubbing, cyanosis, edema, or deformity noted with normal full range of motion of all joints.   Neurologic:  alert & oriented X3 and gait  normal.   Skin:  Intact without suspicious lesions or rashes Cervical Nodes:  No lymphadenopathy noted Axillary Nodes:  No palpable lymphadenopathy Psych:  Cognition and judgment appear intact. Alert and cooperative with normal attention span and concentration. No apparent delusions, illusions, hallucinations

## 2015-04-07 NOTE — Addendum Note (Signed)
Addended by: Liane ComberHAVERS, Sherly Brodbeck C on: 04/07/2015 10:50 AM   Modules accepted: SmartSet

## 2015-04-07 NOTE — Progress Notes (Signed)
Pre visit review using our clinic review tool, if applicable. No additional management support is needed unless otherwise documented below in the visit note. 

## 2015-04-07 NOTE — Assessment & Plan Note (Signed)
Well controlled on current rxs. No changes made today. 

## 2015-04-07 NOTE — Assessment & Plan Note (Signed)
Reviewed preventive care protocols, scheduled due services, and updated immunizations Discussed nutrition, exercise, diet, and healthy lifestyle.  Labs today.  Orders Placed This Encounter  Procedures  . CBC with Differential/Platelet  . Comprehensive metabolic panel  . Lipid panel  . TSH  . Vitamin D, 25-hydroxy  . HIV antibody (with reflex)

## 2015-04-07 NOTE — Patient Instructions (Signed)
Great to see you. Happy Holidays.  We will call you with your lab results. 

## 2015-04-08 LAB — HIV ANTIBODY (ROUTINE TESTING W REFLEX): HIV: NONREACTIVE

## 2015-04-14 ENCOUNTER — Telehealth: Payer: Self-pay | Admitting: Family Medicine

## 2015-04-14 NOTE — Telephone Encounter (Signed)
Patient returned Waynetta's call. °

## 2015-04-15 MED ORDER — VITAMIN D (ERGOCALCIFEROL) 1.25 MG (50000 UNIT) PO CAPS: 50000.0000 [IU] | ORAL_CAPSULE | ORAL | Status: DC

## 2015-04-15 NOTE — Addendum Note (Signed)
Addended by: Desmond DikeKNIGHT, Jacinto Keil H on: 04/15/2015 04:48 PM   Modules accepted: Orders

## 2015-04-22 ENCOUNTER — Other Ambulatory Visit: Payer: Self-pay | Admitting: Family Medicine

## 2015-04-30 ENCOUNTER — Other Ambulatory Visit: Payer: Self-pay | Admitting: Family Medicine

## 2015-05-21 ENCOUNTER — Other Ambulatory Visit: Payer: Self-pay | Admitting: Family Medicine

## 2015-05-21 ENCOUNTER — Encounter: Payer: Self-pay | Admitting: Family Medicine

## 2015-05-25 ENCOUNTER — Other Ambulatory Visit: Payer: BLUE CROSS/BLUE SHIELD

## 2015-05-25 ENCOUNTER — Ambulatory Visit: Payer: Self-pay | Admitting: Family Medicine

## 2015-05-26 ENCOUNTER — Encounter: Payer: Self-pay | Admitting: Family Medicine

## 2015-06-01 ENCOUNTER — Encounter: Payer: Self-pay | Admitting: Family Medicine

## 2015-06-01 ENCOUNTER — Encounter: Payer: Self-pay | Admitting: *Deleted

## 2015-06-22 ENCOUNTER — Other Ambulatory Visit (INDEPENDENT_AMBULATORY_CARE_PROVIDER_SITE_OTHER): Payer: BLUE CROSS/BLUE SHIELD

## 2015-06-22 DIAGNOSIS — E559 Vitamin D deficiency, unspecified: Secondary | ICD-10-CM | POA: Diagnosis not present

## 2015-06-23 LAB — VITAMIN D 25 HYDROXY (VIT D DEFICIENCY, FRACTURES): VITD: 25.43 ng/mL — ABNORMAL LOW (ref 30.00–100.00)

## 2015-07-18 ENCOUNTER — Other Ambulatory Visit: Payer: Self-pay | Admitting: Family Medicine

## 2015-08-09 ENCOUNTER — Emergency Department
Admission: EM | Admit: 2015-08-09 | Discharge: 2015-08-09 | Disposition: A | Discharge status: Home / Self Care | Payer: BLUE CROSS/BLUE SHIELD | Attending: Emergency Medicine | Admitting: Emergency Medicine

## 2015-08-09 ENCOUNTER — Emergency Department: Payer: BLUE CROSS/BLUE SHIELD

## 2015-08-09 ENCOUNTER — Encounter: Payer: Self-pay | Admitting: Emergency Medicine

## 2015-08-09 DIAGNOSIS — Z79899 Other long term (current) drug therapy: Secondary | ICD-10-CM | POA: Diagnosis not present

## 2015-08-09 DIAGNOSIS — F909 Attention-deficit hyperactivity disorder, unspecified type: Secondary | ICD-10-CM | POA: Diagnosis not present

## 2015-08-09 DIAGNOSIS — K429 Umbilical hernia without obstruction or gangrene: Secondary | ICD-10-CM

## 2015-08-09 DIAGNOSIS — Z87891 Personal history of nicotine dependence: Secondary | ICD-10-CM | POA: Insufficient documentation

## 2015-08-09 DIAGNOSIS — F329 Major depressive disorder, single episode, unspecified: Secondary | ICD-10-CM | POA: Insufficient documentation

## 2015-08-09 DIAGNOSIS — R1084 Generalized abdominal pain: Secondary | ICD-10-CM | POA: Diagnosis present

## 2015-08-09 HISTORY — DX: Umbilical hernia without obstruction or gangrene: K42.9

## 2015-08-09 LAB — COMPREHENSIVE METABOLIC PANEL
ALT: 15 U/L (ref 14–54)
ANION GAP: 6 (ref 5–15)
AST: 16 U/L (ref 15–41)
Albumin: 4.4 g/dL (ref 3.5–5.0)
Alkaline Phosphatase: 58 U/L (ref 38–126)
BUN: 13 mg/dL (ref 6–20)
CALCIUM: 9.1 mg/dL (ref 8.9–10.3)
CO2: 28 mmol/L (ref 22–32)
Chloride: 106 mmol/L (ref 101–111)
Creatinine, Ser: 0.9 mg/dL (ref 0.44–1.00)
GFR calc non Af Amer: >60 mL/min (ref >60)
GLUCOSE: 95 mg/dL (ref 65–99)
POTASSIUM: 3.9 mmol/L (ref 3.5–5.1)
SODIUM: 140 mmol/L (ref 135–145)
Total Bilirubin: 0.9 mg/dL (ref 0.3–1.2)
Total Protein: 7.2 g/dL (ref 6.5–8.1)

## 2015-08-09 LAB — URINALYSIS COMPLETE WITH MICROSCOPIC (ARMC ONLY)
BILIRUBIN URINE: NEGATIVE
GLUCOSE, UA: NEGATIVE mg/dL
HGB URINE DIPSTICK: NEGATIVE
KETONES UR: NEGATIVE mg/dL
Leukocytes, UA: NEGATIVE
Nitrite: NEGATIVE
Protein, ur: NEGATIVE mg/dL
SPECIFIC GRAVITY, URINE: 1.015 (ref 1.005–1.030)
pH: 7 (ref 5.0–8.0)

## 2015-08-09 LAB — CBC
HEMATOCRIT: 39 % (ref 35.0–47.0)
HEMOGLOBIN: 14 g/dL (ref 12.0–16.0)
MCH: 33.1 pg (ref 26.0–34.0)
MCHC: 35.9 g/dL (ref 32.0–36.0)
MCV: 92.1 fL (ref 80.0–100.0)
Platelets: 173 10*3/uL (ref 150–440)
RBC: 4.23 MIL/uL (ref 3.80–5.20)
RDW: 12.7 % (ref 11.5–14.5)
WBC: 7 10*3/uL (ref 3.6–11.0)

## 2015-08-09 LAB — PREGNANCY, URINE: PREG TEST UR: NEGATIVE

## 2015-08-09 LAB — LIPASE, BLOOD: LIPASE: 19 U/L (ref 11–51)

## 2015-08-09 MED ORDER — DOCUSATE SODIUM 100 MG PO CAPS: 200.0000 mg | ORAL_CAPSULE | Freq: Two times a day (BID) | ORAL | Status: DC

## 2015-08-09 MED ORDER — DIATRIZOATE MEGLUMINE & SODIUM 66-10 % PO SOLN
15.0000 mL | Freq: Once | ORAL | Status: AC
  Administered 2015-08-09: 15 mL via ORAL
  Filled 2015-08-09: qty 30

## 2015-08-09 MED ORDER — FAMOTIDINE 20 MG PO TABS: 20.0000 mg | ORAL_TABLET | Freq: Two times a day (BID) | ORAL | Status: DC

## 2015-08-09 MED ORDER — ONDANSETRON 4 MG PO TBDP: 4.0000 mg | ORAL_TABLET | Freq: Three times a day (TID) | ORAL | Status: DC | PRN

## 2015-08-09 MED ORDER — IOPAMIDOL (ISOVUE-300) INJECTION 61%
100.0000 mL | Freq: Once | INTRAVENOUS | Status: AC | PRN
  Administered 2015-08-09: 100 mL via INTRAVENOUS

## 2015-08-09 MED ORDER — OXYCODONE-ACETAMINOPHEN 5-325 MG PO TABS: 1.0000 | ORAL_TABLET | Freq: Four times a day (QID) | ORAL | Status: DC | PRN

## 2015-08-09 NOTE — Discharge Instructions (Signed)
You were prescribed a medication that is potentially sedating. Do not drink alcohol, drive or participate in any other potentially dangerous activities while taking this medication as it may make you sleepy. Do not take this medication with any other sedating medications, either prescription or over-the-counter. If you were prescribed Percocet or Vicodin, do not take these with acetaminophen (Tylenol) as it is already contained within these medications.   Opioid pain medications (or "narcotics") can be habit forming.  Use it as little as possible to achieve adequate pain control.  Do not use or use it with extreme caution if you have a history of opiate abuse or dependence.  If you are on a pain contract with your primary care doctor or a pain specialist, be sure to let them know you were prescribed this medication today from the [facility].  This medication is intended for your use only - do not give any to anyone else and keep it in a secure place where nobody else, especially children and pets, have access to it.  It will also cause or worsen constipation, so you may want to consider taking an over-the-counter stool softener while you are taking this medication.  Abdominal Pain, Adult Many things can cause abdominal pain. Usually, abdominal pain is not caused by a disease and will improve without treatment. It can often be observed and treated at home. Your health care provider will do a physical exam and possibly order blood tests and X-rays to help determine the seriousness of your pain. However, in many cases, more time must pass before a clear cause of the pain can be found. Before that point, your health care provider may not know if you need more testing or further treatment. HOME CARE INSTRUCTIONS Monitor your abdominal pain for any changes. The following actions may help to alleviate any discomfort you are experiencing:  Only take over-the-counter or prescription  medicines as directed by your health care provider.  Do not take laxatives unless directed to do so by your health care provider.  Try a clear liquid diet (broth, tea, or water) as directed by your health care provider. Slowly move to a bland diet as tolerated. SEEK MEDICAL CARE IF:  You have unexplained abdominal pain.  You have abdominal pain associated with nausea or diarrhea.  You have pain when you urinate or have a bowel movement.  You experience abdominal pain that wakes you in the night.  You have abdominal pain that is worsened or improved by eating food.  You have abdominal pain that is worsened with eating fatty foods.  You have a fever. SEEK IMMEDIATE MEDICAL CARE IF:  Your pain does not go away within 2 hours.  You keep throwing up (vomiting).  Your pain is felt only in portions of the abdomen, such as the right side or the left lower portion of the abdomen.  You pass bloody or black tarry stools. MAKE SURE YOU:  Understand these instructions.  Will watch your condition.  Will get help right away if you are not doing well or get worse.   This information is not intended to replace advice given to you by your health care provider. Make sure you discuss any questions you have with your health care provider.   Document Released: 01/12/2005 Document Revised: 12/24/2014 Document Reviewed: 12/12/2012 Elsevier Interactive Patient Education 2016 [employer]  Hernia, Adult A hernia is the bulging of an organ or tissue through a weak spot in the muscles of the  abdomen (abdominal wall). Hernias develop most often near the navel or groin. There are many kinds of hernias. Common kinds include:  Femoral hernia. This kind of hernia develops under the groin in the upper thigh area.  Inguinal hernia. This kind of hernia develops in the groin or scrotum.  Umbilical hernia. This kind of hernia develops near the navel.  Hiatal hernia. This kind of hernia causes part of  the stomach to be pushed up into the chest.  Incisional hernia. This kind of hernia bulges through a scar from an abdominal surgery. CAUSES This condition may be caused by:  Heavy lifting.  Coughing over a long period of time.  Straining to have a bowel movement.  An incision made during an abdominal surgery.  A birth defect (congenital defect).  Excess weight or obesity.  Smoking.  Poor nutrition.  Cystic fibrosis.  Excess fluid in the abdomen.  Undescended testicles. SYMPTOMS Symptoms of a hernia include:  A lump on the abdomen. This is the first sign of a hernia. The lump may become more obvious with standing, straining, or coughing. It may get bigger over time if it is not treated or if the condition causing it is not treated.  Pain. A hernia is usually painless, but it may become painful over time if treatment is delayed. The pain is usually dull and may get worse with standing or lifting heavy objects. Sometimes a hernia gets tightly squeezed in the weak spot (strangulated) or stuck there (incarcerated) and causes additional symptoms. These symptoms may include:  Vomiting.  Nausea.  Constipation.  Irritability. DIAGNOSIS A hernia may be diagnosed with:  A physical exam. During the exam your health care provider may ask you to cough or to make a specific movement, because a hernia is usually more visible when you move.  Imaging tests. These can include:  X-rays.  Ultrasound.  CT scan. TREATMENT A hernia that is small and painless may not need to be treated. A hernia that is large or painful may be treated with surgery. Inguinal hernias may be treated with surgery to prevent incarceration or strangulation. Strangulated hernias are always treated with surgery, because lack of blood to the trapped organ or tissue can cause it to die. Surgery to treat a hernia involves pushing the bulge back into place and repairing the weak part of the abdomen. HOME CARE  INSTRUCTIONS  Avoid straining.  Do not lift anything heavier than 10 lb (4.5 kg).  Lift with your leg muscles, not your back muscles. This helps avoid strain.  When coughing, try to cough gently.  Prevent constipation. Constipation leads to straining with bowel movements, which can make a hernia worse or cause a hernia repair to break down. You can prevent constipation by:  Eating a high-fiber diet that includes plenty of fruits and vegetables.  Drinking enough fluids to keep your urine clear or pale yellow. Aim to drink 6-8 glasses of water per day.  Using a stool softener as directed by your health care provider.  Lose weight, if you are overweight.  Do not use any tobacco products, including cigarettes, chewing tobacco, or electronic cigarettes. If you need help quitting, ask your health care provider.  Keep all follow-up visits as directed by your health care provider. This is important. Your health care provider may need to monitor your condition. SEEK MEDICAL CARE IF:  You have swelling, redness, and pain in the affected area.  Your bowel habits change. SEEK IMMEDIATE MEDICAL CARE IF:  You  have a fever.  You have abdominal pain that is getting worse.  You feel nauseous or you vomit.  You cannot push the hernia back in place by gently pressing on it while you are lying down.  The hernia:  Changes in shape or size.  Is stuck outside the abdomen.  Becomes discolored.  Feels hard or tender.   This information is not intended to replace advice given to you by your health care provider. Make sure you discuss any questions you have with your health care provider.   Document Released: 04/04/2005 Document Revised: 04/25/2014 Document Reviewed: 02/12/2014 Elsevier Interactive Patient Education [employer]

## 2015-08-09 NOTE — ED Provider Notes (Signed)
[facility] Emergency Department Provider Note  ____________________________________________  Time seen: 3:40 PM  I have reviewed the triage vital signs and the nursing notes.   HISTORY  Chief Complaint Abdominal Pain    HPI Krystal Mays is a 50 y.o. female who complains of constant abdominal pain for the past 10 days. It is waxing and waning ranging from mild to severe in intensity. Generalized abdominal pain but focus near the umbilicus and to the right side of the abdomen. Has some nausea but no vomiting, no diarrhea, no constipation. Has a history of umbilical hernia that is recurrent. History of cholecystectomy. Tolerating oral intake. No fever.No aggravating or alleviating factors     Past Medical History  Diagnosis Date  . Depression   . Genital warts   . ADHD (attention deficit hyperactivity disorder)   . Hernia, umbilical      Patient Active Problem List   Diagnosis Date Noted  . Well woman exam 04/07/2015  . Pelvic pain in female 05/21/2014  . Encounter for routine gynecological examination 05/21/2014  . Epiploic appendagitis 03/04/2014  . GERD (gastroesophageal reflux disease) 03/04/2011  . Attention deficit disorder 05/24/2010  . Depression 03/25/2010     Past Surgical History  Procedure Laterality Date  . Endometrial ablation  2008     Current Outpatient Rx  Name  Route  Sig  Dispense  Refill  . buPROPion (WELLBUTRIN XL) 300 MG 24 hr tablet      TAKE 1 TABLET BY MOUTH ONCE A DAY   30 tablet   5   . dexmethylphenidate (FOCALIN XR) 15 MG 24 hr capsule   Oral   Take 1 capsule (15 mg total) by mouth daily.   30 capsule   0   . escitalopram (LEXAPRO) 10 MG tablet      TAKE 1 TABLET BY MOUTH ONCE A DAY   30 tablet   7   . omeprazole (PRILOSEC) 20 MG capsule      TAKE 2 CAPSULES BY MOUTH ONCE DAILY AS DIRECTED   60 capsule   5   . Vitamin D, Ergocalciferol, (DRISDOL) 50000 units CAPS capsule   Oral   Take 1  capsule (50,000 Units total) by mouth every 7 (seven) days.   6 capsule   0   . docusate sodium (COLACE) 100 MG capsule   Oral   Take 2 capsules (200 mg total) by mouth 2 (two) times daily.   120 capsule   0   . famotidine (PEPCID) 20 MG tablet   Oral   Take 1 tablet (20 mg total) by mouth 2 (two) times daily.   60 tablet   0   . hydrocortisone-pramoxine (PROCTOFOAM HC) rectal foam   Rectal   Place 1 applicator rectally 2 (two) times daily. Patient taking differently: Place 1 applicator rectally 2 (two) times daily as needed.    10 g   0   . ibuprofen (ADVIL,MOTRIN) 800 MG tablet   Oral   Take 1 tablet (800 mg total) by mouth every 8 (eight) hours as needed.   30 tablet   0   . ondansetron (ZOFRAN ODT) 4 MG disintegrating tablet   Oral   Take 1 tablet (4 mg total) by mouth every 8 (eight) hours as needed for nausea or vomiting.   20 tablet   0   . oxyCODONE-acetaminophen (ROXICET) 5-325 MG tablet   Oral   Take 1 tablet by mouth every 6 (six) hours as needed for  severe pain.   10 tablet   0      Allergies Flagyl   Family History  Problem Relation Age of Onset  . Cancer Mother 67    breast   . Cancer Other     breast    Social History Social History  Substance Use Topics  . Smoking status: Former Games developer  . Smokeless tobacco: Never Used  . Alcohol Use: 0.0 oz/week    0 Standard drinks or equivalent per week    Review of Systems  Constitutional:   No fever or chills.  Eyes:   No vision changes.  ENT:   No sore throat. No rhinorrhea. Cardiovascular:   No chest pain. Respiratory:   No dyspnea or cough. Gastrointestinal:   Abdominal pain as above without vomiting and diarrhea.  No bloody stool. Genitourinary:   Negative for dysuria or difficulty urinating. Musculoskeletal:   Negative for focal pain or swelling Neurological:   Negative for headaches 10-point ROS otherwise negative.  ____________________________________________   PHYSICAL  EXAM:  VITAL SIGNS: ED Triage Vitals  Enc Vitals Group     BP 08/09/15 1437 101/81 mmHg     Pulse Rate 08/09/15 1437 72     Resp 08/09/15 1437 18     Temp 08/09/15 1437 98.6 F (37 C)     Temp Source 08/09/15 1437 Oral     SpO2 08/09/15 1437 100 %     Weight 08/09/15 1437 225 lb (102.059 kg)     Height 08/09/15 1437  (1.651 m)     Head Cir --      Peak Flow --      Pain Score 08/09/15 1437 4     Pain Loc --      Pain Edu? --      Excl. in GC? --     Vital signs reviewed, nursing assessments reviewed.   Constitutional:   Alert and oriented. Well appearing and in no distress. Eyes:   No scleral icterus. No conjunctival pallor. PERRL. EOMI ENT   Head:   Normocephalic and atraumatic.   Nose:   No congestion/rhinnorhea. No septal hematoma   Mouth/Throat:   MMM, no pharyngeal erythema. No peritonsillar mass.    Neck:   No stridor. No SubQ emphysema. No meningismus. Hematological/Lymphatic/Immunilogical:   No cervical lymphadenopathy. Cardiovascular:   RRR. Symmetric bilateral radial and DP pulses.  No murmurs.  Respiratory:   Normal respiratory effort without tachypnea nor retractions. Breath sounds are clear and equal bilaterally. No wheezes/rales/rhonchi. Gastrointestinal:   Soft with periumbilical and right lower quadrant tenderness. Non distended. There is no CVA tenderness.  No rebound, rigidity, or guarding. Genitourinary:   deferred Musculoskeletal:   Nontender with normal range of motion in all extremities. No joint effusions.  No lower extremity tenderness.  No edema. Neurologic:   Normal speech and language.  CN 2-10 normal. Motor grossly intact. No gross focal neurologic deficits are appreciated.  Skin:    Skin is warm, dry and intact. No rash noted.  No petechiae, purpura, or bullae.  ____________________________________________    LABS (pertinent positives/negatives) (all labs ordered are listed, but only abnormal results are displayed) Labs  Reviewed  URINALYSIS COMPLETEWITH MICROSCOPIC ([facility] ONLY) - Abnormal; Notable for the following:    Color, Urine YELLOW (*)    APPearance CLEAR (*)    Bacteria, UA RARE (*)    Squamous Epithelial / LPF 0-5 (*)    All other components within normal limits  LIPASE, BLOOD  COMPREHENSIVE  METABOLIC PANEL  CBC  PREGNANCY, URINE   ____________________________________________   EKG    ____________________________________________    RADIOLOGY  CT abdomen and pelvis unremarkable except for 2 cm umbilical hernia  ____________________________________________   PROCEDURES   ____________________________________________   INITIAL IMPRESSION / ASSESSMENT AND PLAN / ED COURSE  Pertinent labs & imaging results that were available during my care of the patient were reviewed by me and considered in my medical decision making (see chart for details).  Patient well appearing no acute distress. Complains of abdominal pain that is worsening and constant over about 10 days. No evidence of severe hernia or obstruction. Low suspicion for perforation. We'll proceed with CT scan due to her concerns being suspicious for appendicitis.  ----------------------------------------- 7:05 PM on 08/09/2015 -----------------------------------------  CT unremarkable. Abdominal exam is improved. Patient feeling better, calm and comfortable, symptoms controlled. We'll discharge home with bowel regimen, analgesics, antiemetics, follow up with primary care. Given usual return precautions, counseled on hernia care.     ____________________________________________   FINAL CLINICAL IMPRESSION(S) / ED DIAGNOSES  Final diagnoses:  Umbilical hernia without obstruction and without gangrene  Generalized abdominal pain       Portions of this note were generated with dragon dictation software. Dictation errors may occur despite best attempts at proofreading.   Sharman Cheek, MD 08/09/15 [phone]

## 2015-08-09 NOTE — ED Notes (Signed)
Discharge instructions reviewed with patient. Patient verbalized understanding. Patient ambulated to lobby without difficulty.   

## 2015-08-09 NOTE — ED Notes (Signed)
[facility], c/o abdominal pain for 10 days. Patient states pain began below the umbilicus and has moved upward around the umbilicus and to the right. Patient denies constipation. Patient c/o nausea with increased pain.

## 2015-08-12 ENCOUNTER — Ambulatory Visit: Payer: Self-pay | Admitting: Family Medicine

## 2015-08-12 ENCOUNTER — Ambulatory Visit (INDEPENDENT_AMBULATORY_CARE_PROVIDER_SITE_OTHER): Payer: BLUE CROSS/BLUE SHIELD | Admitting: Internal Medicine

## 2015-08-12 ENCOUNTER — Encounter: Payer: Self-pay | Admitting: Internal Medicine

## 2015-08-12 ENCOUNTER — Other Ambulatory Visit: Payer: Self-pay | Admitting: Internal Medicine

## 2015-08-12 VITALS — BP 110/76 | HR 78 | Temp 98.1°F | Wt 228.0 lb

## 2015-08-12 DIAGNOSIS — R1084 Generalized abdominal pain: Secondary | ICD-10-CM | POA: Diagnosis not present

## 2015-08-12 DIAGNOSIS — K429 Umbilical hernia without obstruction or gangrene: Secondary | ICD-10-CM

## 2015-08-12 LAB — H. PYLORI ANTIBODY, IGG: H Pylori IgG: NEGATIVE

## 2015-08-12 NOTE — Patient Instructions (Signed)
Hernia, Adult A hernia is the bulging of an organ or tissue through a weak spot in the muscles of the abdomen (abdominal wall). Hernias develop most often near the navel or groin. There are many kinds of hernias. Common kinds include:  Femoral hernia. This kind of hernia develops under the groin in the upper thigh area.  Inguinal hernia. This kind of hernia develops in the groin or scrotum.  Umbilical hernia. This kind of hernia develops near the navel.  Hiatal hernia. This kind of hernia causes part of the stomach to be pushed up into the chest.  Incisional hernia. This kind of hernia bulges through a scar from an abdominal surgery. CAUSES This condition may be caused by:  Heavy lifting.  Coughing over a long period of time.  Straining to have a bowel movement.  An incision made during an abdominal surgery.  A birth defect (congenital defect).  Excess weight or obesity.  Smoking.  Poor nutrition.  Cystic fibrosis.  Excess fluid in the abdomen.  Undescended testicles. SYMPTOMS Symptoms of a hernia include:  A lump on the abdomen. This is the first sign of a hernia. The lump may become more obvious with standing, straining, or coughing. It may get bigger over time if it is not treated or if the condition causing it is not treated.  Pain. A hernia is usually painless, but it may become painful over time if treatment is delayed. The pain is usually dull and may get worse with standing or lifting heavy objects. Sometimes a hernia gets tightly squeezed in the weak spot (strangulated) or stuck there (incarcerated) and causes additional symptoms. These symptoms may include:  Vomiting.  Nausea.  Constipation.  Irritability. DIAGNOSIS A hernia may be diagnosed with:  A physical exam. During the exam your health care provider may ask you to cough or to make a specific movement, because a hernia is usually more visible when you move.  Imaging tests. These can  include:  X-rays.  Ultrasound.  CT scan. TREATMENT A hernia that is small and painless may not need to be treated. A hernia that is large or painful may be treated with surgery. Inguinal hernias may be treated with surgery to prevent incarceration or strangulation. Strangulated hernias are always treated with surgery, because lack of blood to the trapped organ or tissue can cause it to die. Surgery to treat a hernia involves pushing the bulge back into place and repairing the weak part of the abdomen. HOME CARE INSTRUCTIONS  Avoid straining.  Do not lift anything heavier than 10 lb (4.5 kg).  Lift with your leg muscles, not your back muscles. This helps avoid strain.  When coughing, try to cough gently.  Prevent constipation. Constipation leads to straining with bowel movements, which can make a hernia worse or cause a hernia repair to break down. You can prevent constipation by:  Eating a high-fiber diet that includes plenty of fruits and vegetables.  Drinking enough fluids to keep your urine clear or pale yellow. Aim to drink 6-8 glasses of water per day.  Using a stool softener as directed by your health care provider.  Lose weight, if you are overweight.  Do not use any tobacco products, including cigarettes, chewing tobacco, or electronic cigarettes. If you need help quitting, ask your health care provider.  Keep all follow-up visits as directed by your health care provider. This is important. Your health care provider may need to monitor your condition. SEEK MEDICAL CARE IF:  You have   swelling, redness, and pain in the affected area.  Your bowel habits change. SEEK IMMEDIATE MEDICAL CARE IF:  You have a fever.  You have abdominal pain that is getting worse.  You feel nauseous or you vomit.  You cannot push the hernia back in place by gently pressing on it while you are lying down.  The hernia:  Changes in shape or size.  Is stuck outside the  abdomen.  Becomes discolored.  Feels hard or tender.   This information is not intended to replace advice given to you by your health care provider. Make sure you discuss any questions you have with your health care provider.   Document Released: 04/04/2005 Document Revised: 04/25/2014 Document Reviewed: 02/12/2014 Elsevier Interactive Patient Education 2016 [employer]  

## 2015-08-12 NOTE — Progress Notes (Signed)
Pre visit review using our clinic review tool, if applicable. No additional management support is needed unless otherwise documented below in the visit note. 

## 2015-08-12 NOTE — Progress Notes (Signed)
Subjective:    Patient ID: Krystal Mays, female    DOB: 27-Sep-1965, 50 y.o.   MRN: [id number]  HPI  Pt presents to the clinic today for a ER f/u for abd pain. She started having pain 2 weeks ago, first noticed with bright red blood in the toilet. She has not noticed any blood since. She went to UC when the pain persisted, and was sent to the ER for further evaluation. CT showed a 2 cm umbilical hernia, which pt has had for 20 yrs. She was prescribed Zofran, Famotidine, Roxicet and Colace, none of which she has filled. She reports the pain is different than her hernia pain. The pain increases 30-60 minutes after eating. The pain moves throughout her abdomen. She denies fevers, uriniry sxs or N/V. Her stool had been soft, but she has not had a BM in the past few days. She reports her abd is more swollen, but denies bruising. She has a family history of IBS. She has had a cholecystectomy. She has been taking Advil 800 mg daily prn.   Review of Systems   Past Medical History  Diagnosis Date  . Depression   . Genital warts   . ADHD (attention deficit hyperactivity disorder)   . Hernia, umbilical     Current Outpatient Prescriptions  Medication Sig Dispense Refill  . buPROPion (WELLBUTRIN XL) 300 MG 24 hr tablet TAKE 1 TABLET BY MOUTH ONCE A DAY 30 tablet 5  . Cholecalciferol (VITAMIN D3) 5000 units CAPS Take 1 capsule by mouth daily.    Marland Kitchen dexmethylphenidate (FOCALIN XR) 15 MG 24 hr capsule Take 1 capsule (15 mg total) by mouth daily. 30 capsule 0  . escitalopram (LEXAPRO) 10 MG tablet TAKE 1 TABLET BY MOUTH ONCE A DAY 30 tablet 7  . ibuprofen (ADVIL,MOTRIN) 800 MG tablet Take 1 tablet (800 mg total) by mouth every 8 (eight) hours as needed. 30 tablet 0  . omeprazole (PRILOSEC) 20 MG capsule TAKE 2 CAPSULES BY MOUTH ONCE DAILY AS DIRECTED 60 capsule 5  . docusate sodium (COLACE) 100 MG capsule Take 2 capsules (200 mg total) by mouth 2 (two) times daily. (Patient not taking: Reported on  08/12/2015) 120 capsule 0  . ondansetron (ZOFRAN ODT) 4 MG disintegrating tablet Take 1 tablet (4 mg total) by mouth every 8 (eight) hours as needed for nausea or vomiting. (Patient not taking: Reported on 08/12/2015) 20 tablet 0  . oxyCODONE-acetaminophen (ROXICET) 5-325 MG tablet Take 1 tablet by mouth every 6 (six) hours as needed for severe pain. (Patient not taking: Reported on 08/12/2015) 10 tablet 0   No current facility-administered medications for this visit.    Allergies  Allergen Reactions  . Flagyl [Metronidazole] Nausea Only    Family History  Problem Relation Age of Onset  . Cancer Mother 78    breast   . Cancer Other     breast    Social History   Social History  . Marital Status: Married    Spouse Name: N/A  . Number of Children: 2  . 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 Smoking status: Former Games developer  . Smokeless tobacco: Never Used  . Alcohol Use: 0.0 oz/week    0 Standard drinks or equivalent per week  . Drug Use: Not on file  . Sexual Activity: Not on file   Other Topics Concern  . Not on file  Social History Narrative     Constitutional: Denies fever, fatigue, headache or abrupt weight changes.  Respiratory: Denies difficulty breathing, shortness of breath, or cough.   Cardiovascular: Denies chest pain, chest tightness, or palpitations. Denies swelling in the feet Gastrointestinal: Pt reports abdominal pain. Pt had 1 episode of bright red blood in the stool. Pt reports her abd is swollen. Pt denies reflux.. GU: Denies urgency, frequency, pain with urination, burning sensation, blood in urine, odor or discharge. Pt denies back pain  No other specific complaints in a complete review of systems (except as listed in HPI above).     Objective:   Physical Exam  BP 110/76 mmHg  Pulse 78  Temp(Src) 98.1 F (36.7 C) (Oral)  Wt 228 lb (103.42 kg)  SpO2 98% Wt Readings from Last 3  Encounters:  08/12/15 228 lb (103.42 kg)  08/09/15 225 lb (102.059 kg)  04/07/15 218 lb 8 oz (99.111 kg)    General: Appears her state age, obese in NAD. Skin: Warm, dry and intact.  Cardiovascular: Normal rate and rhythm. S1,S2 noted.  No murmur, rubs or gallops noted. No BLE edema. Pulmonary/Chest: Normal effort and positive vesicular breath sounds. No respiratory distress. No wheezes, rales or ronchi noted.  Abdomen: TTP throughout the abdomen with some guarding. Normal bowel sounds. Soft, non distended. No masses noted. Liver, spleen and kidneys non palpable. No CVA tenderness. Umbilical hernia noted, but not strangulated. Rectal: No external hemorrhoids noted. Normal rectal tone. No internal mass noted.  BMET    Component Value Date/Time   NA 140 08/09/2015 1440   NA 142 02/25/2014 1805   K 3.9 08/09/2015 1440   K 3.7 02/25/2014 1805   CL 106 08/09/2015 1440   CL 107 02/25/2014 1805   CO2 28 08/09/2015 1440   CO2 27 02/25/2014 1805   GLUCOSE 95 08/09/2015 1440   GLUCOSE 89 02/25/2014 1805   BUN 13 08/09/2015 1440   BUN 18 02/25/2014 1805   CREATININE 0.90 08/09/2015 1440   CREATININE 0.92 02/25/2014 1805   CALCIUM 9.1 08/09/2015 1440   CALCIUM 8.3* 02/25/2014 1805   GFRNONAA >60 08/09/2015 1440   GFRNONAA >60 02/25/2014 1805   GFRAA >60 08/09/2015 1440   GFRAA >60 02/25/2014 1805    Lipid Panel     Component Value Date/Time   CHOL 200 04/07/2015 1000   TRIG 131.0 04/07/2015 1000   HDL 43.50 04/07/2015 1000   CHOLHDL 5 04/07/2015 1000   VLDL 26.2 04/07/2015 1000   LDLCALC 130* 04/07/2015 1000    CBC    Component Value Date/Time   WBC 7.0 08/09/2015 1440   WBC 7.8 02/25/2014 1805   RBC 4.23 08/09/2015 1440   RBC 4.13 02/25/2014 1805   HGB 14.0 08/09/2015 1440   HGB 13.4 02/25/2014 1805   HCT 39.0 08/09/2015 1440   HCT 38.6 02/25/2014 1805   PLT 173 08/09/2015 1440   PLT 177 02/25/2014 1805   MCV 92.1 08/09/2015 1440   MCV 93 02/25/2014 1805   MCH  33.1 08/09/2015 1440   MCH 32.5 02/25/2014 1805   MCHC 35.9 08/09/2015 1440   MCHC 34.8 02/25/2014 1805   RDW 12.7 08/09/2015 1440   RDW 13.0 02/25/2014 1805   LYMPHSABS 1.8 04/07/2015 1000   LYMPHSABS 2.1 02/25/2014 1805   MONOABS 0.4 04/07/2015 1000   MONOABS 0.6 02/25/2014 1805   EOSABS 0.1 04/07/2015 1000   EOSABS 0.1 02/25/2014 1805   BASOSABS 0.0 04/07/2015 1000   BASOSABS 0.0 02/25/2014 1805  Hgb A1C No results found for: HGBA1C       Assessment & Plan:   ER follow up for abdominal pain:  She reports this is not related to her umbilical hernia ER notes, labs and imaging reviewed Stop Ibuprofen, take Tylenol instead Continue daily Prilosec Hemoccult negative H. Pylori test ordered If H Pylori positive- will treat If H Pylori negative- refer to GI for further evaluation  Will follow up after labs, RTC as needed

## 2015-08-14 ENCOUNTER — Telehealth: Payer: Self-pay

## 2015-08-14 NOTE — Telephone Encounter (Signed)
Noted! Thank you

## 2015-08-14 NOTE — Telephone Encounter (Signed)
Spoke to pt about lab reults as they were released to mychart and confirmed referral to GI---pt was aware and stated she had a VM from referral coor---pt states she is very busy today and will call back Mon

## 2015-10-15 ENCOUNTER — Other Ambulatory Visit: Payer: Self-pay | Admitting: Family Medicine

## 2015-10-29 ENCOUNTER — Encounter: Payer: Self-pay | Admitting: Family Medicine

## 2015-10-29 ENCOUNTER — Encounter: Payer: Self-pay | Admitting: Internal Medicine

## 2015-11-03 ENCOUNTER — Encounter: Payer: Self-pay | Admitting: Family Medicine

## 2015-12-08 ENCOUNTER — Telehealth: Payer: Self-pay | Admitting: Family Medicine

## 2015-12-08 NOTE — Telephone Encounter (Signed)
Lm on pt vm and informed her she may now contact office and schedule TB skin test for any day except Thursday. Once read, pt will be able to obtain completed paperwork

## 2015-12-08 NOTE — Telephone Encounter (Signed)
Pt dropped off health exam form for job to be filled out for job. I placed form in Rx tower.

## 2015-12-14 ENCOUNTER — Ambulatory Visit (INDEPENDENT_AMBULATORY_CARE_PROVIDER_SITE_OTHER): Payer: BLUE CROSS/BLUE SHIELD | Admitting: *Deleted

## 2015-12-14 DIAGNOSIS — Z111 Encounter for screening for respiratory tuberculosis: Secondary | ICD-10-CM

## 2015-12-14 NOTE — Telephone Encounter (Signed)
Lm on pts vm and informed her TB skin test is required for form completion. [facility]

## 2015-12-18 ENCOUNTER — Other Ambulatory Visit: Payer: Self-pay | Admitting: Family Medicine

## 2016-03-18 ENCOUNTER — Other Ambulatory Visit: Payer: Self-pay | Admitting: Family Medicine

## 2016-04-28 ENCOUNTER — Encounter: Payer: Self-pay | Admitting: Family Medicine

## 2016-06-09 ENCOUNTER — Other Ambulatory Visit: Payer: Self-pay | Admitting: Family Medicine

## 2016-06-09 DIAGNOSIS — Z1231 Encounter for screening mammogram for malignant neoplasm of breast: Secondary | ICD-10-CM

## 2016-06-16 ENCOUNTER — Other Ambulatory Visit: Payer: Self-pay | Admitting: Family Medicine

## 2016-06-27 ENCOUNTER — Ambulatory Visit
Admission: RE | Admit: 2016-06-27 | Discharge: 2016-06-27 | Disposition: A | Discharge status: Home / Self Care | Payer: BC Managed Care – PPO | Source: Ambulatory Visit | Attending: Family Medicine | Admitting: Family Medicine

## 2016-06-27 DIAGNOSIS — Z1231 Encounter for screening mammogram for malignant neoplasm of breast: Secondary | ICD-10-CM | POA: Diagnosis not present

## 2016-07-18 ENCOUNTER — Other Ambulatory Visit: Payer: Self-pay | Admitting: Family Medicine

## 2016-07-18 NOTE — Telephone Encounter (Signed)
Dr Ainsley Spinner to Refill??

## 2016-08-29 ENCOUNTER — Encounter: Payer: Self-pay | Admitting: Family Medicine

## 2016-08-29 ENCOUNTER — Ambulatory Visit (INDEPENDENT_AMBULATORY_CARE_PROVIDER_SITE_OTHER): Payer: BC Managed Care – PPO | Admitting: Family Medicine

## 2016-08-29 DIAGNOSIS — L0390 Cellulitis, unspecified: Secondary | ICD-10-CM | POA: Diagnosis not present

## 2016-08-29 MED ORDER — DOXYCYCLINE HYCLATE 100 MG PO TABS: 100.0000 mg | ORAL_TABLET | Freq: Two times a day (BID) | ORAL | 0 refills | Status: DC

## 2016-08-29 NOTE — Assessment & Plan Note (Signed)
New- treat with 7 day course of oral doxycyline 100 mg twice daily. Continue to keep area covered while at work. Call or return to clinic prn if these symptoms worsen or fail to improve as anticipated. The patient indicates understanding of these issues and agrees with the plan.

## 2016-08-29 NOTE — Progress Notes (Signed)
Subjective:   Patient ID: Krystal Mays, female    DOB: June 05, 1965, 51 y.o.   MRN: [id number]  Krystal MarrowKristi T Mays is a pleasant 51 y.o. year old female who presents to clinic today with Hand Injury (swollen and had some drainage yesterday)  on 08/29/2016  HPI:  Hurt her left hand while pressure washing two days ago.  Has been keeping it covered with antibiotic ointment but today entire top of her hand around the laceration is red, swollen and warm.  No fevers or other systemic symptoms.  Current Outpatient Prescriptions on File Prior to Visit  Medication Sig Dispense Refill  . buPROPion (WELLBUTRIN XL) 300 MG 24 hr tablet TAKE 1 TABLET BY MOUTH ONCE DAILY 30 tablet 2  . Cholecalciferol (VITAMIN D3) 5000 units CAPS Take 1 capsule by mouth daily.    Marland Kitchen. dexmethylphenidate (FOCALIN XR) 15 MG 24 hr capsule Take 1 capsule (15 mg total) by mouth daily. 30 capsule 0  . docusate sodium (COLACE) 100 MG capsule Take 2 capsules (200 mg total) by mouth 2 (two) times daily. 120 capsule 0  . escitalopram (LEXAPRO) 10 MG tablet TAKE 1 TABLET BY MOUTH ONCE A DAY 30 tablet 2  . ibuprofen (ADVIL,MOTRIN) 800 MG tablet Take 1 tablet (800 mg total) by mouth every 8 (eight) hours as needed. 30 tablet 0  . omeprazole (PRILOSEC) 20 MG capsule TAKE 2 CAPSULES BY MOUTH ONCE DAILY AS DIRECTED 60 capsule 5  . ondansetron (ZOFRAN ODT) 4 MG disintegrating tablet Take 1 tablet (4 mg total) by mouth every 8 (eight) hours as needed for nausea or vomiting. 20 tablet 0  . oxyCODONE-acetaminophen (ROXICET) 5-325 MG tablet Take 1 tablet by mouth every 6 (six) hours as needed for severe pain. 10 tablet 0   No current facility-administered medications on file prior to visit.     Allergies  Allergen Reactions  . Flagyl [Metronidazole] Nausea Only    Past Medical History:  Diagnosis Date  . ADHD (attention deficit hyperactivity disorder)   . Depression   . Genital warts   . Hernia, umbilical     Past Surgical  History:  Procedure Laterality Date  . ENDOMETRIAL ABLATION  2008    Family History  Problem Relation Age of Onset  . Cancer Other        breast  . Cancer Mother [id number]       breast   . Breast cancer Mother [id number]    Social History   Social History  . Marital status: Married    Spouse name: N/A  . Number of children: 2  . 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 Smoking status: Former Games developermoker  . Smokeless tobacco: Never Used  . Alcohol use 0.0 oz/week  . Drug use: Unknown  . Sexual activity: Not on file   Other Topics Concern  . Not on file   Social History Narrative  . No narrative on file   The PMH, PSH, Social History, Family History, Medications, and allergies [facility], and have been updated if relevant.  Review of Systems  Constitutional: Negative.   Gastrointestinal: Negative.   Skin: Positive for wound.  All other systems reviewed and are negative.      Objective:    BP 130/70   Pulse 81   Temp 98.5 F (36.9 C)   Wt 223 lb (101.2 kg)   SpO2  98%   BMI 37.11 kg/m    Physical Exam  Constitutional: She is oriented to person, place, and time. She appears well-developed and well-nourished. No distress.  HENT:  Head: Normocephalic and atraumatic.  Eyes: Conjunctivae are normal.  Cardiovascular: Normal rate.   Pulmonary/Chest: Effort normal.  Musculoskeletal: Normal range of motion.  Neurological: She is alert and oriented to person, place, and time. No cranial nerve deficit.  Skin: She is not diaphoretic.     Psychiatric: She has a normal mood and affect. Her behavior is normal. Judgment and thought content normal.  Nursing note and vitals reviewed.         Assessment & Plan:   Wound cellulitis No Follow-up on file.

## 2016-08-29 NOTE — Patient Instructions (Signed)
Great to see you.  Take doxycycline as directed- 1 tablet twice daily for 7 days.

## 2016-09-15 ENCOUNTER — Other Ambulatory Visit: Payer: Self-pay | Admitting: Family Medicine

## 2016-09-15 NOTE — Telephone Encounter (Signed)
escitalopram last filled on 07/18/16 #30 +2, WELLBUTRIN last filled on 07/18/16 #30 +2, Last OV 08/29/16

## 2016-10-13 ENCOUNTER — Other Ambulatory Visit: Payer: Self-pay | Admitting: Family Medicine

## 2017-02-16 ENCOUNTER — Other Ambulatory Visit: Payer: Self-pay | Admitting: Family Medicine

## 2017-03-20 ENCOUNTER — Other Ambulatory Visit: Payer: Self-pay | Admitting: Family Medicine

## 2017-04-21 ENCOUNTER — Other Ambulatory Visit: Payer: Self-pay | Admitting: Family Medicine

## 2017-05-22 ENCOUNTER — Other Ambulatory Visit: Payer: Self-pay | Admitting: Family Medicine

## 2017-05-23 ENCOUNTER — Encounter: Payer: Self-pay | Admitting: Family Medicine

## 2017-05-24 ENCOUNTER — Ambulatory Visit: Payer: BC Managed Care – PPO | Admitting: Family Medicine

## 2017-05-24 ENCOUNTER — Encounter: Payer: Self-pay | Admitting: Family Medicine

## 2017-05-24 VITALS — BP 122/80 | HR 75 | Temp 97.7°F | Ht 65.0 in | Wt 224.6 lb

## 2017-05-24 DIAGNOSIS — G4700 Insomnia, unspecified: Secondary | ICD-10-CM | POA: Insufficient documentation

## 2017-05-24 DIAGNOSIS — R5383 Other fatigue: Secondary | ICD-10-CM | POA: Diagnosis not present

## 2017-05-24 LAB — CBC
HCT: 38.2 % (ref 36.0–46.0)
HEMOGLOBIN: 13.3 g/dL (ref 12.0–15.0)
MCHC: 34.8 g/dL (ref 30.0–36.0)
MCV: 92.2 fl (ref 78.0–100.0)
PLATELETS: 176 10*3/uL (ref 150.0–400.0)
RBC: 4.14 Mil/uL (ref 3.87–5.11)
RDW: 12.8 % (ref 11.5–15.5)
WBC: 5.7 10*3/uL (ref 4.0–10.5)

## 2017-05-24 LAB — TSH: TSH: 2.12 u[IU]/mL (ref 0.35–4.50)

## 2017-05-24 LAB — COMPREHENSIVE METABOLIC PANEL
ALBUMIN: 4.7 g/dL (ref 3.5–5.2)
ALK PHOS: 61 U/L (ref 39–117)
ALT: 12 U/L (ref 0–35)
AST: 13 U/L (ref 0–37)
BILIRUBIN TOTAL: 0.8 mg/dL (ref 0.2–1.2)
BUN: 20 mg/dL (ref 6–23)
CO2: 29 mEq/L (ref 19–32)
CREATININE: 1.05 mg/dL (ref 0.40–1.20)
Calcium: 9.6 mg/dL (ref 8.4–10.5)
Chloride: 105 mEq/L (ref 96–112)
GFR: 58.92 mL/min — ABNORMAL LOW (ref >60.00)
GLUCOSE: 97 mg/dL (ref 70–99)
POTASSIUM: 4.6 meq/L (ref 3.5–5.1)
SODIUM: 142 meq/L (ref 135–145)
TOTAL PROTEIN: 7 g/dL (ref 6.0–8.3)

## 2017-05-24 LAB — MAGNESIUM: MAGNESIUM: 2.2 mg/dL (ref 1.5–2.5)

## 2017-05-24 LAB — VITAMIN D 25 HYDROXY (VIT D DEFICIENCY, FRACTURES): VITD: 33.6 ng/mL (ref 30.00–100.00)

## 2017-05-24 LAB — VITAMIN B12: Vitamin B-12: 564 pg/mL (ref 211–911)

## 2017-05-24 NOTE — Progress Notes (Signed)
Subjective:   Patient ID: Krystal Mays, female    DOB: Oct 29, 1965, 52 y.o.   MRN: [id number]  Krystal Mays is a pleasant 52 y.o. year old female who presents to clinic today with Fatigue (Patient is here today C/O fatigue.  Wakes up tired.  Gets to sleep and sleeps well and wakes only once at night.  She does snore.  Neck is 15.5 inches.  She started taking Vit-D  and B-12 aout 3-4 weeks ago.  She does share that 2 weeks ago she donated blood 2 weeks ago, and 3 wks before that she gave platelets.) and Joint Pain (She is also C/O achey joints in left knee and ankle.  Aching started 3 weeks ago then the swelling started about 2 weeks ago.  )  on 05/24/2017  HPI:   Fatigue- Wakes up tired. Feels she sleeps well- gets up once a night to let the dog out and can fall back asleep.  Started Vit D and Vit B12 3-4 weeks ago.  Also having achy joints.  Mainly hands and knees.  Admits to eating more salt and sugar lately.   Wt Readings from Last 3 Encounters:  05/24/17 224 lb 9.6 oz (101.9 kg)  08/29/16 223 lb (101.2 kg)  08/12/15 228 lb (103.4 kg)     Current Outpatient Medications on File Prior to Visit  Medication Sig Dispense Refill  . buPROPion (WELLBUTRIN XL) 300 MG 24 hr tablet TAKE 1 TABLET BY MOUTH ONCE DAILY 30 tablet 0  . Cholecalciferol (VITAMIN D3) 5000 units CAPS Take 2 capsules by mouth daily.     . Cyanocobalamin (VITAMIN B 12 PO) Take 2 tablets by mouth daily.    Marland Kitchen escitalopram (LEXAPRO) 10 MG tablet TAKE 1 TABLET BY MOUTH ONCE DAILY 30 tablet 0  . ibuprofen (ADVIL,MOTRIN) 800 MG tablet Take 1 tablet (800 mg total) by mouth every 8 (eight) hours as needed. 30 tablet 0  . omeprazole (PRILOSEC) 20 MG capsule TAKE 2 CAPSULES BY MOUTH ONCE DAILY AS DIRECTED 60 capsule 0  . dexmethylphenidate (FOCALIN XR) 15 MG 24 hr capsule Take 1 capsule (15 mg total) by mouth daily. (Patient not taking: Reported on 05/24/2017) 30 capsule 0   No current facility-administered medications on file  prior to visit.     Allergies  Allergen Reactions  . Flagyl [Metronidazole] Nausea Only    Past Medical History:  Diagnosis Date  . ADHD (attention deficit hyperactivity disorder)   . Depression   . Genital warts   . Hernia, umbilical     Past Surgical History:  Procedure Laterality Date  . ENDOMETRIAL ABLATION  2008    Family History  Problem Relation Age of Onset  . Cancer Other        breast  . Cancer Mother 51       breast   . Breast cancer Mother 48    Social History   Socioeconomic History  . Marital status: Married    Spouse name: Not on file  . Number of children: 2  . Years of education: Not on file  . Highest education level: Not on file  Social Needs  . Financial resource strain: Not on file  . Food insecurity - worry: Not on file  . Food insecurity - inability: Not on file  . Transportation needs - medical: Not on file  . Transportation needs - non-medical: Not on file  Occupational History  . 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 Smoking status: Former Games developermoker  . Smokeless tobacco: Never Used  Substance and Sexual Activity  . Alcohol use: Yes    Alcohol/week: 0.0 oz  . Drug use: Not on file  . Sexual activity: Not on file  Other Topics Concern  . Not on file  Social History Narrative  . Not on file   The PMH, PSH, Social History, Family History, Medications, and allergies [facility], and have been updated if relevant.   Review of Systems  Constitutional: Positive for fever.  Musculoskeletal: Positive for arthralgias.  Psychiatric/Behavioral: Positive for sleep disturbance. Negative for behavioral problems, confusion, decreased concentration, dysphoric mood and hallucinations.  All other systems reviewed and are negative.      Objective:    BP 122/80 (BP Location: Left Arm, Patient Position: Sitting, Cuff Size: Normal)   Pulse 75   Temp 97.7 F (36.5 C) (Oral)   Ht 5'  5" (1.651 m)   Wt 224 lb 9.6 oz (101.9 kg)   SpO2 97%   BMI 37.38 kg/m    Physical Exam   General:  Well-developed,well-nourished,in no acute distress; alert,appropriate and cooperative throughout examination Head:  normocephalic and atraumatic.   Eyes:  vision grossly intact, PERRL Ears:  R ear normal and L ear normal externally, TMs clear bilaterally Nose:  no external deformity.   Mouth:  good dentition.   Neck:  No deformities, masses, or tenderness noted. Lungs:  Normal respiratory effort, chest expands symmetrically. Lungs are clear to auscultation, no crackles or wheezes. Heart:  Normal rate and regular rhythm. S1 and S2 normal without gallop, murmur, click, rub or other extra sounds. Abdomen:  Bowel sounds positive,abdomen soft and non-tender without masses, organomegaly or hernias noted. Msk:  No deformity or scoliosis noted of thoracic or lumbar spine.   Extremities:  No clubbing, cyanosis, edema, or deformity noted with normal full range of motion of all joints.   Neurologic:  alert & oriented X3 and gait normal.   Skin:  Intact without suspicious lesions or rashes Psych:  Cognition and judgment appear intact. Alert and cooperative with normal attention span and concentration. No apparent delusions, illusions, hallucinations       Assessment & Plan:   Other fatigue - Plan: CBC, B12, Vitamin D (25 hydroxy), TSH, Magnesium, Comprehensive metabolic panel No Follow-up on file.

## 2017-05-24 NOTE — Assessment & Plan Note (Signed)
Likely multifactorial. Feels she sleeps well. Will start with labs today. The patient indicates understanding of these issues and agrees with the plan. Orders Placed This Encounter  Procedures  . CBC  . B12  . Vitamin D (25 hydroxy)  . TSH  . Magnesium  . Comprehensive metabolic panel

## 2017-05-24 NOTE — Patient Instructions (Signed)
Great to see you. I will call you with your lab results from today and you can view them online.   

## 2017-06-13 ENCOUNTER — Other Ambulatory Visit: Payer: Self-pay | Admitting: Family Medicine

## 2017-06-14 ENCOUNTER — Other Ambulatory Visit: Payer: BC Managed Care – PPO

## 2017-06-20 ENCOUNTER — Encounter: Payer: BC Managed Care – PPO | Admitting: Family Medicine

## 2017-06-20 ENCOUNTER — Other Ambulatory Visit: Payer: Self-pay | Admitting: Family Medicine

## 2017-06-21 ENCOUNTER — Encounter: Payer: Self-pay | Admitting: Family Medicine

## 2017-06-21 ENCOUNTER — Encounter: Payer: Self-pay | Admitting: Gastroenterology

## 2017-06-21 ENCOUNTER — Other Ambulatory Visit (HOSPITAL_COMMUNITY)
Admission: RE | Admit: 2017-06-21 | Discharge: 2017-06-21 | Disposition: A | Discharge status: Home / Self Care | Payer: BC Managed Care – PPO | Source: Ambulatory Visit | Attending: Family Medicine | Admitting: Family Medicine

## 2017-06-21 ENCOUNTER — Ambulatory Visit (INDEPENDENT_AMBULATORY_CARE_PROVIDER_SITE_OTHER): Payer: BC Managed Care – PPO | Admitting: Family Medicine

## 2017-06-21 VITALS — BP 110/64 | HR 84 | Temp 98.1°F | Ht 65.75 in | Wt 223.6 lb

## 2017-06-21 DIAGNOSIS — F908 Attention-deficit hyperactivity disorder, other type: Secondary | ICD-10-CM

## 2017-06-21 DIAGNOSIS — Z23 Encounter for immunization: Secondary | ICD-10-CM

## 2017-06-21 DIAGNOSIS — Z01419 Encounter for gynecological examination (general) (routine) without abnormal findings: Secondary | ICD-10-CM

## 2017-06-21 DIAGNOSIS — F329 Major depressive disorder, single episode, unspecified: Secondary | ICD-10-CM

## 2017-06-21 DIAGNOSIS — Z1211 Encounter for screening for malignant neoplasm of colon: Secondary | ICD-10-CM | POA: Insufficient documentation

## 2017-06-21 DIAGNOSIS — Z1231 Encounter for screening mammogram for malignant neoplasm of breast: Secondary | ICD-10-CM | POA: Insufficient documentation

## 2017-06-21 DIAGNOSIS — Z0001 Encounter for general adult medical examination with abnormal findings: Secondary | ICD-10-CM | POA: Diagnosis not present

## 2017-06-21 DIAGNOSIS — F32A Depression, unspecified: Secondary | ICD-10-CM

## 2017-06-21 NOTE — Assessment & Plan Note (Signed)
Stable.  No changes made. 

## 2017-06-21 NOTE — Assessment & Plan Note (Signed)
Reviewed preventive care protocols, scheduled due services, and updated immunizations Discussed nutrition, exercise, diet, and healthy lifestyle.  Mammogram ordered. Has never had a colonoscopy- she agrees to GI referral. Referral placed.

## 2017-06-21 NOTE — Progress Notes (Signed)
52 yo G2P2 here for CPX and follow up of chronic medical conditions.  No h/o abnormal pap smears. Last pap smear done by me on 05/21/14.  Mammogram 06/27/16  Health Maintenance  Topic Date Due  . COLONOSCOPY  03/17/2017  . PAP SMEAR  05/21/2017  . TETANUS/TDAP  06/21/2017 (Originally 03/27/2017)  . MAMMOGRAM  06/27/2017  . INFLUENZA VACCINE  Completed  . HIV Screening  Completed    ADD- currently taking Focalin 15 mg as needed and Wellbutrin 300 mg XL daily. She feels her symptoms are well controlled.  Depression- Currently taking Lexapro 10 mg daily, previously took Cymbalta. She feels her symptoms of anxiety and depression are well controlled.  Lab Results  Component Value Date   CHOL 200 04/07/2015   HDL 43.50 04/07/2015   LDLCALC 130 (H) 04/07/2015   TRIG 131.0 04/07/2015   CHOLHDL 5 04/07/2015    Lab Results  Component Value Date   WBC 5.7 05/24/2017   HGB 13.3 05/24/2017   HCT 38.2 05/24/2017   MCV 92.2 05/24/2017   PLT 176.0 05/24/2017   Lab Results  Component Value Date   TSH 2.12 05/24/2017   Lab Results  Component Value Date   NA 142 05/24/2017   K 4.6 05/24/2017   CL 105 05/24/2017   CO2 29 05/24/2017   Current Outpatient Medications on File Prior to Visit  Medication Sig Dispense Refill  . buPROPion (WELLBUTRIN XL) 300 MG 24 hr tablet TAKE 1 TABLET BY MOUTH ONCE DAILY *NEED APPOINTMENT* 30 tablet 0  . Cholecalciferol (VITAMIN D3) 5000 units CAPS Take 2 capsules by mouth daily.     . Cyanocobalamin (VITAMIN B 12 PO) Take 2 tablets by mouth daily.    Marland Kitchen dexmethylphenidate (FOCALIN XR) 15 MG 24 hr capsule Take 1 capsule (15 mg total) by mouth daily. 30 capsule 0  . escitalopram (LEXAPRO) 10 MG tablet TAKE 1 TABLET BY MOUTH ONCE DAILY *NEED TO SCHEDULE APPOINTMENT* 30 tablet 0  . ibuprofen (ADVIL,MOTRIN) 800 MG tablet Take 1 tablet (800 mg total) by mouth every 8 (eight) hours as needed. 30 tablet 0  . omeprazole (PRILOSEC) 20 MG capsule TAKE 2 CAPSULES  BY MOUTH ONCE DAILY AS DIRECTED 60 capsule 0   No current facility-administered medications on file prior to visit.     Allergies  Allergen Reactions  . Flagyl [Metronidazole] Nausea Only    Past Medical History:  Diagnosis Date  . ADHD (attention deficit hyperactivity disorder)   . Depression   . Genital warts   . Hernia, umbilical     Past Surgical History:  Procedure Laterality Date  . ENDOMETRIAL ABLATION  2008    Family History  Problem Relation Age of Onset  . Cancer Other        breast  . Cancer Mother 72       breast   . Breast cancer Mother 22    Social History   Socioeconomic History  . Marital status: Married    Spouse name: Not on file  . Number of children: 2  . Years of education: Not on file  . Highest education level: Not on file  Social Needs  . Financial resource strain: Not on file  . Food insecurity - worry: Not on file  . Food insecurity - inability: Not on file  . Transportation needs - medical: Not on file  . Transportation needs - non-medical: Not on file  Occupational History  . 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 Smoking status: Former Games developer  . Smokeless tobacco: Never Used  Substance and Sexual Activity  . Alcohol use: Yes    Alcohol/week: 0.0 oz  . Drug use: Not on file  . Sexual activity: Not on file  Other Topics Concern  . Not on file  Social History Narrative  . Not on file   The PMH, PSH, Social History, Family History, Medications, and allergies have been reviewed in [facility], and have been updated if relevant.    Review of Systems  Constitutional: Negative for fatigue.  Eyes: Negative.   Respiratory: Negative.   Cardiovascular: Negative.   Gastrointestinal: Negative.   Endocrine: Negative.   Genitourinary: Negative.   Musculoskeletal: Negative.   Allergic/Immunologic: Negative.   Neurological: Negative.   Hematological: Negative.   Psychiatric/Behavioral:  Negative.   All other systems reviewed and are negative.    Patient Active Problem List   Diagnosis Date Noted  . Well woman exam with routine gynecological exam 06/21/2017  . Epiploic appendagitis 03/04/2014  . Attention deficit disorder 05/24/2010  . Depression 03/25/2010   Past Medical History:  Diagnosis Date  . ADHD (attention deficit hyperactivity disorder)   . Depression   . Genital warts   . Hernia, umbilical    Past Surgical History:  Procedure Laterality Date  . ENDOMETRIAL ABLATION  2008   Social History   Tobacco Use  . Smoking status: Former Games developer  . Smokeless tobacco: Never Used  Substance Use Topics  . Alcohol use: Yes    Alcohol/week: 0.0 oz  . Drug use: Not on file   Family History  Problem Relation Age of Onset  . Cancer Other        breast  . Cancer Mother 26       breast   . Breast cancer Mother 59   Allergies  Allergen Reactions  . Flagyl [Metronidazole] Nausea Only   Current Outpatient Medications on File Prior to Visit  Medication Sig Dispense Refill  . buPROPion (WELLBUTRIN XL) 300 MG 24 hr tablet TAKE 1 TABLET BY MOUTH ONCE DAILY *NEED APPOINTMENT* 30 tablet 0  . Cholecalciferol (VITAMIN D3) 5000 units CAPS Take 2 capsules by mouth daily.     . Cyanocobalamin (VITAMIN B 12 PO) Take 2 tablets by mouth daily.    Marland Kitchen dexmethylphenidate (FOCALIN XR) 15 MG 24 hr capsule Take 1 capsule (15 mg total) by mouth daily. 30 capsule 0  . escitalopram (LEXAPRO) 10 MG tablet TAKE 1 TABLET BY MOUTH ONCE DAILY *NEED TO SCHEDULE APPOINTMENT* 30 tablet 0  . ibuprofen (ADVIL,MOTRIN) 800 MG tablet Take 1 tablet (800 mg total) by mouth every 8 (eight) hours as needed. 30 tablet 0  . omeprazole (PRILOSEC) 20 MG capsule TAKE 2 CAPSULES BY MOUTH ONCE DAILY AS DIRECTED 60 capsule 0   No current facility-administered medications on file prior to visit.    The PMH, PSH, Social History, Family History, Medications, and allergies have been reviewed in [facility], and have  been updated if relevant.  Physical Exam BP 110/64 (BP Location: Left Arm, Patient Position: Sitting, Cuff Size: Normal)   Pulse 84   Temp 98.1 F (36.7 C) (Oral)   Ht 5' 5.75" (1.67 m)   Wt 223 lb 9.6 oz (101.4 kg)   SpO2 97%   BMI 36.36 kg/m    General:  Well-developed,well-nourished,in no acute distress; alert,appropriate and cooperative throughout examination Head:  normocephalic and atraumatic.   Eyes:  vision grossly intact, PERRL  Ears:  R ear normal and L ear normal externally, TMs clear bilaterally Nose:  no external deformity.   Mouth:  good dentition.   Neck:  No deformities, masses, or tenderness noted. Breasts:  No mass, nodules, thickening, tenderness, bulging, retraction, inflamation, nipple discharge or skin changes noted.   Lungs:  Normal respiratory effort, chest expands symmetrically. Lungs are clear to auscultation, no crackles or wheezes. Heart:  Normal rate and regular rhythm. S1 and S2 normal without gallop, murmur, click, rub or other extra sounds. Abdomen:  Bowel sounds positive,abdomen soft and non-tender without masses, organomegaly or hernias noted. Rectal:  no external abnormalities.   Genitalia:  Pelvic Exam:        External: normal female genitalia without lesions or masses        Vagina: normal without lesions or masses        Cervix: normal without lesions or masses        Adnexa: normal bimanual exam without masses or fullness        Uterus: normal by palpation        Pap smear: performed Msk:  No deformity or scoliosis noted of thoracic or lumbar spine.   Extremities:  No clubbing, cyanosis, edema, or deformity noted with normal full range of motion of all joints.   Neurologic:  alert & oriented X3 and gait normal.   Skin:  Intact without suspicious lesions or rashes Cervical Nodes:  No lymphadenopathy noted Axillary Nodes:  No palpable lymphadenopathy Psych:  Cognition and judgment appear intact. Alert and cooperative with normal attention  span and concentration. No apparent delusions, illusions, hallucinations

## 2017-06-21 NOTE — Patient Instructions (Signed)
Great to see you. Please call [place] to set up your mammogram.   

## 2017-06-22 LAB — CYTOLOGY - PAP
Bacterial vaginitis: NEGATIVE
CANDIDA VAGINITIS: NEGATIVE
Diagnosis: NEGATIVE
HPV: NOT DETECTED

## 2017-07-21 ENCOUNTER — Other Ambulatory Visit: Payer: Self-pay | Admitting: Family Medicine

## 2017-07-27 ENCOUNTER — Other Ambulatory Visit: Payer: Self-pay

## 2017-07-27 ENCOUNTER — Ambulatory Visit (AMBULATORY_SURGERY_CENTER): Payer: Self-pay

## 2017-07-27 ENCOUNTER — Encounter: Payer: Self-pay | Admitting: Gastroenterology

## 2017-07-27 VITALS — Ht 65.0 in | Wt 220.0 lb

## 2017-07-27 DIAGNOSIS — Z1211 Encounter for screening for malignant neoplasm of colon: Secondary | ICD-10-CM

## 2017-07-27 MED ORDER — NA SULFATE-K SULFATE-MG SULF 17.5-3.13-1.6 GM/177ML PO SOLN: 1.0000 | Freq: Once | ORAL | 0 refills | Status: AC

## 2017-07-27 NOTE — Progress Notes (Signed)
Denies allergies to eggs or soy products. Denies complication of anesthesia or sedation. Denies use of weight loss medication. Denies use of O2.   Emmi instructions declined.  

## 2017-08-07 ENCOUNTER — Telehealth: Payer: Self-pay

## 2017-08-07 NOTE — Telephone Encounter (Signed)
No charge if she reschedules.  

## 2017-08-07 NOTE — Telephone Encounter (Signed)
Patient called to cx her procedure for [place] 08-09-17. She states it's due to insurance and finances. Would you like to charge for late cancellation?

## 2017-08-09 ENCOUNTER — Encounter: Payer: BC Managed Care – PPO | Admitting: Gastroenterology

## 2017-08-14 ENCOUNTER — Emergency Department
Admission: EM | Admit: 2017-08-14 | Discharge: 2017-08-14 | Disposition: A | Discharge status: Home / Self Care | Payer: BC Managed Care – PPO | Attending: Emergency Medicine | Admitting: Emergency Medicine

## 2017-08-14 ENCOUNTER — Emergency Department: Payer: BC Managed Care – PPO

## 2017-08-14 ENCOUNTER — Other Ambulatory Visit: Payer: Self-pay

## 2017-08-14 DIAGNOSIS — Z87891 Personal history of nicotine dependence: Secondary | ICD-10-CM | POA: Insufficient documentation

## 2017-08-14 DIAGNOSIS — Z79899 Other long term (current) drug therapy: Secondary | ICD-10-CM | POA: Insufficient documentation

## 2017-08-14 DIAGNOSIS — R1031 Right lower quadrant pain: Secondary | ICD-10-CM | POA: Insufficient documentation

## 2017-08-14 DIAGNOSIS — R110 Nausea: Secondary | ICD-10-CM | POA: Diagnosis not present

## 2017-08-14 LAB — URINALYSIS, COMPLETE (UACMP) WITH MICROSCOPIC
Bilirubin Urine: NEGATIVE
Glucose, UA: NEGATIVE mg/dL
Hgb urine dipstick: NEGATIVE
Ketones, ur: 5 mg/dL — AB
Leukocytes, UA: NEGATIVE
Nitrite: NEGATIVE
PROTEIN: 30 mg/dL — AB
SPECIFIC GRAVITY, URINE: 1.036 — AB (ref 1.005–1.030)
pH: 5 (ref 5.0–8.0)

## 2017-08-14 LAB — COMPREHENSIVE METABOLIC PANEL
ALBUMIN: 4.6 g/dL (ref 3.5–5.0)
ALT: 17 U/L (ref 14–54)
AST: 21 U/L (ref 15–41)
Alkaline Phosphatase: 72 U/L (ref 38–126)
Anion gap: 9 (ref 5–15)
BILIRUBIN TOTAL: 0.4 mg/dL (ref 0.3–1.2)
BUN: 21 mg/dL — AB (ref 6–20)
CO2: 25 mmol/L (ref 22–32)
Calcium: 8.8 mg/dL — ABNORMAL LOW (ref 8.9–10.3)
Chloride: 104 mmol/L (ref 101–111)
Creatinine, Ser: 0.97 mg/dL (ref 0.44–1.00)
GFR calc Af Amer: >60 mL/min (ref >60)
GFR calc non Af Amer: >60 mL/min (ref >60)
GLUCOSE: 121 mg/dL — AB (ref 65–99)
POTASSIUM: 3.5 mmol/L (ref 3.5–5.1)
SODIUM: 138 mmol/L (ref 135–145)
TOTAL PROTEIN: 7.1 g/dL (ref 6.5–8.1)

## 2017-08-14 LAB — CBC
HEMATOCRIT: 37.5 % (ref 35.0–47.0)
HEMOGLOBIN: 13.2 g/dL (ref 12.0–16.0)
MCH: 31.9 pg (ref 26.0–34.0)
MCHC: 35.3 g/dL (ref 32.0–36.0)
MCV: 90.4 fL (ref 80.0–100.0)
Platelets: 152 10*3/uL (ref 150–440)
RBC: 4.15 MIL/uL (ref 3.80–5.20)
RDW: 13 % (ref 11.5–14.5)
WBC: 6.6 10*3/uL (ref 3.6–11.0)

## 2017-08-14 LAB — LIPASE, BLOOD: Lipase: 33 U/L (ref 11–51)

## 2017-08-14 LAB — POCT PREGNANCY, URINE: PREG TEST UR: NEGATIVE

## 2017-08-14 MED ORDER — IOPAMIDOL (ISOVUE-300) INJECTION 61%
30.0000 mL | Freq: Once | INTRAVENOUS | Status: AC
  Administered 2017-08-14: 30 mL via ORAL

## 2017-08-14 MED ORDER — IOPAMIDOL (ISOVUE-370) INJECTION 76%
75.0000 mL | Freq: Once | INTRAVENOUS | Status: AC | PRN
  Administered 2017-08-14: 75 mL via INTRAVENOUS

## 2017-08-14 MED ORDER — IBUPROFEN 600 MG PO TABS
600.0000 mg | ORAL_TABLET | Freq: Once | ORAL | Status: AC
  Administered 2017-08-14: 600 mg via ORAL
  Filled 2017-08-14: qty 1

## 2017-08-14 NOTE — Discharge Instructions (Addendum)
You should make an appointment to follow-up with your regular doctor within the next 1 to 2 weeks.  You may continue to take ibuprofen (up to 600 mg every 6 hours) as needed for pain.  Return to the ER for new, worsening, or persistent pain, fevers, vomiting, weakness, vaginal bleeding, or any other new or worsening symptoms that concern you.

## 2017-08-14 NOTE — ED Notes (Signed)

## 2017-08-14 NOTE — ED Notes (Signed)
Patient transported to CT 

## 2017-08-14 NOTE — ED Triage Notes (Signed)
Ptc/o RLQ pain that started last week and seemed to go away, states today the pain returned and has gotten worse. States she has a hx of ovarian cyst but also still has here appendix. Denies N/V/Dfever.. Increased urinary frequency.

## 2017-08-14 NOTE — ED Provider Notes (Signed)
[facility] Emergency Department Provider Note ____________________________________________   First MD Initiated Contact with Patient 08/14/17 1933     (approximate)  I have reviewed the triage vital signs and the nursing notes.   HISTORY  Chief Complaint Abdominal Pain    HPI Krystal Mays is a 52 y.o. female with PMH as noted below who presents with right lower quadrant abdominal pain over the course of the day today, initially improved with ibuprofen but now resumed, nonradiating, associated with nausea but no vomiting.  No change in bowel movements, and no urinary symptoms.  Patient denies vaginal bleeding or discharge.  She states that she sometimes has similar pain when she ovulates, but this is more persistent and intense.  Past Medical History:  Diagnosis Date  . ADHD (attention deficit hyperactivity disorder)   . Depression   . Genital warts   . GERD (gastroesophageal reflux disease)   . Hernia, umbilical     Patient Active Problem List   Diagnosis Date Noted  . Well woman exam with routine gynecological exam 06/21/2017  . Epiploic appendagitis 03/04/2014  . Attention deficit disorder 05/24/2010  . Depression 03/25/2010    Past Surgical History:  Procedure Laterality Date  . CESAREAN SECTION     two  . CHOLECYSTECTOMY    . ENDOMETRIAL ABLATION  2008  . UMBILICAL HERNIA REPAIR      Prior to Admission medications   Medication Sig Start Date End Date Taking? Authorizing Provider  buPROPion (WELLBUTRIN XL) 300 MG 24 hr tablet Take 1qd 07/21/17   Dianne Dun, MD  Cholecalciferol (VITAMIN D3) 5000 units CAPS Take 2 capsules by mouth daily.     [provider]  dexmethylphenidate (FOCALIN XR) 15 MG 24 hr capsule Take 1 capsule (15 mg total) by mouth daily. 12/01/14   Dianne Dun, MD  escitalopram (LEXAPRO) 10 MG tablet Take 1qd 07/21/17   Dianne Dun, MD  ibuprofen (ADVIL,MOTRIN) 800 MG tablet Take 1 tablet (800 mg total) by  mouth every 8 (eight) hours as needed. 05/22/14   Dianne Dun, MD  omeprazole (PRILOSEC) 20 MG capsule TAKE 2 CAPSULES BY MOUTH ONCE DAILY AS INSTRUCTED. 07/21/17   Dianne Dun, MD    Allergies Flagyl [metronidazole]  Family History  Problem Relation Age of Onset  . Cancer Other        breast  . Cancer Mother 49       breast   . Breast cancer Mother 49  . Colon cancer Neg Hx   . Esophageal cancer Neg Hx   . Liver cancer Neg Hx   . Pancreatic cancer Neg Hx   . Rectal cancer Neg Hx   . Stomach cancer Neg Hx     Social History Social History   Tobacco Use  . Smoking status: Former Games developer  . Smokeless tobacco: Never Used  Substance Use Topics  . Alcohol use: Yes    Alcohol/week: 0.0 oz    Comment: socially  . Drug use: Never    Review of Systems  Constitutional: No fever. Eyes: No redness. ENT: No sore throat. Cardiovascular: Denies chest pain. Respiratory: Denies shortness of breath. Gastrointestinal: Positive for nausea.  Genitourinary: Negative for dysuria, vaginal bleeding or discharge.  Musculoskeletal: Negative for back pain. Skin: Negative for rash. Neurological: Negative for headache.   ____________________________________________   PHYSICAL EXAM:  VITAL SIGNS: ED Triage Vitals  Enc Vitals Group     BP 08/14/17 1852 130/82  Pulse Rate 08/14/17 1852 63     Resp 08/14/17 1852 16     Temp 08/14/17 1852 97.6 F (36.4 C)     Temp Source 08/14/17 1852 Oral     SpO2 08/14/17 1852 99 %     Weight 08/14/17 1853 220 lb (99.8 kg)     Height 08/14/17 1853  (1.651 m)     Head Circumference --      Peak Flow --      Pain Score 08/14/17 1853 5     Pain Loc --      Pain Edu? --      Excl. in GC? --     Constitutional: Alert and oriented. Well appearing and in no acute distress. Eyes: Conjunctivae are normal.  Head: Atraumatic. Nose: No congestion/rhinnorhea. Mouth/Throat: Mucous membranes are moist.   Neck: Normal range of motion.    Cardiovascular: Good peripheral circulation. Respiratory: Normal respiratory effort.   Gastrointestinal: Soft with mild right lower quadrant/right suprapubic tenderness. No distention.  Genitourinary: No CVA tenderness. Musculoskeletal: No lower extremity edema.  Extremities warm and well perfused.  Neurologic:  Normal speech and language. No gross focal neurologic deficits are appreciated.  Skin:  Skin is warm and dry. No rash noted. Psychiatric: Mood and affect are normal. Speech and behavior are normal.  ____________________________________________   LABS (all labs ordered are listed, but only abnormal results are displayed)  Labs Reviewed  COMPREHENSIVE METABOLIC PANEL - Abnormal; Notable for the following components:      Result Value   Glucose, Bld 121 (*)    BUN 21 (*)    Calcium 8.8 (*)    All other components within normal limits  URINALYSIS, COMPLETE (UACMP) WITH MICROSCOPIC - Abnormal; Notable for the following components:   Color, Urine YELLOW (*)    APPearance HAZY (*)    Specific Gravity, Urine 1.036 (*)    Ketones, ur 5 (*)    Protein, ur 30 (*)    Bacteria, UA FEW (*)    All other components within normal limits  LIPASE, BLOOD  CBC  POC URINE PREG, ED  POCT PREGNANCY, URINE   ____________________________________________  EKG   ____________________________________________  RADIOLOGY  CT abdomen: Normal appendix.  No other acute findings.  ____________________________________________   PROCEDURES  Procedure(s) performed: No  Procedures  Critical Care performed: No ____________________________________________   INITIAL IMPRESSION / ASSESSMENT AND PLAN / ED COURSE  Pertinent labs & imaging results that were available during my care of the patient were reviewed by me and considered in my medical decision making (see chart for details).  52 year old female with PMH as noted above presents with right lower quadrant abdominal pain over the  course of the day today, associated with nausea but no other acute symptoms.  Patient has had several abdominal surgeries but has not had her appendix removed.  She reports similar pain with ovulation but less intense.  On exam, vital signs are normal, the patient is well-appearing, in the range of the exam is as described.  There is some tenderness in the right lower quadrant/right suprapubic area.  Given that the patient is perimenopausal and has no vaginal symptoms, I have a lower suspicion for acute pelvic etiology.  Differential includes appendicitis, mesenteric adenitis or other benign cause, fibroid, ovarian cyst, or still possibly mittelschmerz.  Given patient's age, overall well appearance and the mild nature of the pain, I do not suspect or ovarian torsion.  Plan: Labs, UA, CT abdomen, and reassess.  If CT shows any possible pelvic pathology, will follow up with ultrasound.   ----------------------------------------- 8:52 PM on 08/14/2017 -----------------------------------------  Lab work-up is unremarkable.  CT shows normal appendix and no other acute findings.  No findings on CT to the reproductive system, so there is no indication for further pelvic imaging at this time.  The patient's pain is well controlled on ibuprofen and she appears comfortable.  She would like to go home.  Return precautions given, and she expresses understanding.  She agrees to follow-up with her regular doctor.  ____________________________________________   FINAL CLINICAL IMPRESSION(S) / ED DIAGNOSES  Final diagnoses:  Right lower quadrant abdominal pain      NEW MEDICATIONS STARTED DURING THIS VISIT:  New Prescriptions   No medications on file     Note:  This document was prepared using Dragon voice recognition software and may include unintentional dictation errors.    Dionne Bucy, MD 08/14/17 2052

## 2017-12-16 ENCOUNTER — Other Ambulatory Visit: Payer: Self-pay | Admitting: Family Medicine

## 2018-01-16 ENCOUNTER — Other Ambulatory Visit: Payer: Self-pay | Admitting: Family Medicine

## 2018-01-25 ENCOUNTER — Encounter: Payer: Self-pay | Admitting: Family Medicine

## 2018-01-25 ENCOUNTER — Ambulatory Visit: Payer: BC Managed Care – PPO | Admitting: Family Medicine

## 2018-01-25 ENCOUNTER — Other Ambulatory Visit (HOSPITAL_COMMUNITY)
Admission: RE | Admit: 2018-01-25 | Discharge: 2018-01-25 | Disposition: A | Discharge status: Home / Self Care | Payer: BC Managed Care – PPO | Source: Ambulatory Visit | Attending: Family Medicine | Admitting: Family Medicine

## 2018-01-25 VITALS — BP 126/68 | HR 64 | Temp 98.2°F | Ht 65.75 in | Wt 221.6 lb

## 2018-01-25 DIAGNOSIS — N898 Other specified noninflammatory disorders of vagina: Secondary | ICD-10-CM | POA: Diagnosis present

## 2018-01-25 DIAGNOSIS — M25551 Pain in right hip: Secondary | ICD-10-CM

## 2018-01-25 LAB — POCT URINALYSIS DIPSTICK
Bilirubin, UA: NEGATIVE
Blood, UA: NEGATIVE
Glucose, UA: NEGATIVE
Ketones, UA: NEGATIVE
LEUKOCYTES UA: NEGATIVE
Nitrite, UA: NEGATIVE
Protein, UA: NEGATIVE
Spec Grav, UA: 1.025 (ref 1.010–1.025)
Urobilinogen, UA: 0.2 E.U./dL
pH, UA: 6 (ref 5.0–8.0)

## 2018-01-25 MED ORDER — TERCONAZOLE 0.4 % VA CREA: 1.0000 | TOPICAL_CREAM | Freq: Every day | VAGINAL | 0 refills | Status: DC

## 2018-01-25 NOTE — Patient Instructions (Signed)

## 2018-01-25 NOTE — Progress Notes (Signed)
SUBJECTIVE:  52 y.o. female complains of white, copious and creamy vaginal discharge for 1 week(s). Denies abnormal vaginal bleeding or significant pelvic pain or fever. No UTI symptoms. Denies history of known exposure to STD.  No LMP recorded. Patient has had an ablation.     Current Outpatient Medications on File Prior to Visit  Medication Sig Dispense Refill  . buPROPion (WELLBUTRIN XL) 300 MG 24 hr tablet Take 1qd; plz sched appt 30 tablet 2  . Cholecalciferol (VITAMIN D3) 5000 units CAPS Take 2 capsules by mouth daily.     Marland Kitchen dexmethylphenidate (FOCALIN XR) 15 MG 24 hr capsule Take 1 capsule (15 mg total) by mouth daily. 30 capsule 0  . escitalopram (LEXAPRO) 10 MG tablet TAKE 1 TABLET BY MOUTH ONCE DAILY 30 tablet 3  . ibuprofen (ADVIL,MOTRIN) 800 MG tablet Take 1 tablet (800 mg total) by mouth every 8 (eight) hours as needed. 30 tablet 0  . omeprazole (PRILOSEC) 20 MG capsule TAKE 2 CAPSULES BY MOUTH ONCE DAILY AS INSTRUCTED. 60 capsule prn   No current facility-administered medications on file prior to visit.     Allergies  Allergen Reactions  . Flagyl [Metronidazole] Nausea Only    Past Medical History:  Diagnosis Date  . ADHD (attention deficit hyperactivity disorder)   . Depression   . Genital warts   . GERD (gastroesophageal reflux disease)   . Hernia, umbilical     Past Surgical History:  Procedure Laterality Date  . CESAREAN SECTION     two  . CHOLECYSTECTOMY    . ENDOMETRIAL ABLATION  2008  . UMBILICAL HERNIA REPAIR      Family History  Problem Relation Age of Onset  . Cancer Other        breast  . Cancer Mother 67       breast   . Breast cancer Mother 73  . Colon cancer Neg Hx   . Esophageal cancer Neg Hx   . Liver cancer Neg Hx   . Pancreatic cancer Neg Hx   . Rectal cancer Neg Hx   . Stomach cancer Neg Hx     Social History   Socioeconomic History  . Marital status: Married    Spouse name: Not on file  . Number of children: 2  . Years  of education: Not on file  . Highest education level: Not on file  Occupational History  . Occupation: 3rd grade teacher    Employer: [employer]  Social Needs  . Financial resource strain: Not on file  . Food insecurity:    Worry: Not on file    Inability: Not on file  . Transportation needs:    Medical: Not on file    Non-medical: Not on file  Tobacco Use  . Smoking status: Former Games developer  . Smokeless tobacco: Never Used  Substance and Sexual Activity  . Alcohol use: Yes    Alcohol/week: 0.0 standard drinks    Comment: socially  . Drug use: Never  . Sexual activity: Not on file  Lifestyle  . Physical activity:    Days per week: Not on file    Minutes per session: Not on file  . Stress: Not on file  Relationships  . Social connections:    Talks on phone: Not on file    Gets together: Not on file    Attends religious service: Not on file    Active member of club or organization: Not on file    Attends meetings of  clubs or organizations: Not on file    Relationship status: Not on file  . Intimate partner violence:    Fear of current or ex partner: Not on file    Emotionally abused: Not on file    Physically abused: Not on file    Forced sexual activity: Not on file  Other Topics Concern  . Not on file  Social History Narrative  . Not on file   The PMH, PSH, Social History, Family History, Medications, and allergies have been reviewed in [facility], and have been updated if relevant.  Review of Systems  Constitutional: Negative.   HENT: Negative.   Respiratory: Negative.   Gastrointestinal: Negative.   Genitourinary: Negative for dysuria, frequency and urgency.       + pelvic pain + vaginal discharge  Skin: Negative.   Neurological: Negative.   Psychiatric/Behavioral: Negative.   All other systems reviewed and are negative.    OBJECTIVE:  BP 126/68 (BP Location: Left Arm, Patient Position: Sitting, Cuff Size: Normal)   Pulse 64   Temp 98.2 F (36.8  C) (Oral)   Ht 5' 5.75" (1.67 m)   Wt 221 lb 9.6 oz (100.5 kg)   SpO2 98%   BMI 36.04 kg/m   She appears well, afebrile. Abdomen: benign, soft, nontender, no masses. Pelvic Exam: normal external genitalia, vulva, vagina, cervix, uterus and adnexa, thick white discharge. Urine dipstick: negative for all components.  ASSESSMENT:  nonspecific vaginitis- probable yeast  PLAN:  Declined STD testing. Treatment: Terazol cream and abstain from coitus during course of treatment ROV prn if symptoms persist or worsen.

## 2018-01-26 LAB — CERVICOVAGINAL ANCILLARY ONLY
Bacterial vaginitis: NEGATIVE
CANDIDA VAGINITIS: NEGATIVE
Trichomonas: NEGATIVE

## 2018-04-19 ENCOUNTER — Other Ambulatory Visit: Payer: Self-pay | Admitting: Family Medicine

## 2018-05-19 ENCOUNTER — Other Ambulatory Visit: Payer: Self-pay | Admitting: Family Medicine

## 2018-06-18 ENCOUNTER — Other Ambulatory Visit: Payer: Self-pay | Admitting: Family Medicine

## 2018-08-17 ENCOUNTER — Other Ambulatory Visit: Payer: Self-pay | Admitting: Family Medicine

## 2018-09-06 ENCOUNTER — Telehealth: Payer: Self-pay

## 2018-09-06 ENCOUNTER — Telehealth: Payer: BC Managed Care – PPO | Admitting: Family Medicine

## 2018-09-06 NOTE — Telephone Encounter (Signed)
I LMOVM for pt to call the office to go through check-in process for her video appt with Dr. Aron/thx dmf

## 2018-09-17 ENCOUNTER — Other Ambulatory Visit: Payer: Self-pay | Admitting: Family Medicine

## 2018-10-25 ENCOUNTER — Other Ambulatory Visit: Payer: Self-pay | Admitting: *Deleted

## 2018-10-25 DIAGNOSIS — Z20822 Contact with and (suspected) exposure to covid-19: Secondary | ICD-10-CM

## 2018-10-30 LAB — NOVEL CORONAVIRUS, NAA: SARS-CoV-2, NAA: NOT DETECTED

## 2018-12-18 ENCOUNTER — Other Ambulatory Visit: Payer: Self-pay | Admitting: Family Medicine

## 2019-03-26 ENCOUNTER — Other Ambulatory Visit: Payer: Self-pay

## 2019-03-26 DIAGNOSIS — Z20822 Contact with and (suspected) exposure to covid-19: Secondary | ICD-10-CM

## 2019-03-28 LAB — NOVEL CORONAVIRUS, NAA: SARS-CoV-2, NAA: NOT DETECTED

## 2019-04-17 ENCOUNTER — Other Ambulatory Visit: Payer: Self-pay | Admitting: Family Medicine

## 2019-09-27 ENCOUNTER — Other Ambulatory Visit: Payer: Self-pay

## 2019-09-27 ENCOUNTER — Encounter: Payer: Self-pay | Admitting: Nurse Practitioner

## 2019-09-27 ENCOUNTER — Ambulatory Visit: Payer: BC Managed Care – PPO | Admitting: Nurse Practitioner

## 2019-09-27 VITALS — BP 112/70 | HR 89 | Temp 97.7°F | Ht 65.75 in | Wt 225.0 lb

## 2019-09-27 DIAGNOSIS — Z1231 Encounter for screening mammogram for malignant neoplasm of breast: Secondary | ICD-10-CM | POA: Diagnosis not present

## 2019-09-27 DIAGNOSIS — F418 Other specified anxiety disorders: Secondary | ICD-10-CM

## 2019-09-27 MED ORDER — BUPROPION HCL ER (XL) 300 MG PO TB24: 300.0000 mg | ORAL_TABLET | Freq: Every day | ORAL | 3 refills | Status: DC

## 2019-09-27 MED ORDER — ESCITALOPRAM OXALATE 10 MG PO TABS: 10.0000 mg | ORAL_TABLET | Freq: Every day | ORAL | 3 refills | Status: DC

## 2019-09-27 NOTE — Progress Notes (Signed)
Established Patient Office Visit  Subjective:  Patient ID: Krystal Mays, female    DOB: 1966-03-20  Age: 54 y.o. MRN: [id number]  CC:  Chief Complaint  Patient presents with  . Transitions Of Care    med refill    HPI Krystal Mays presents to establish care and needs her Depression medications refilled. She is overdue for her Mammogram and asks to get that ordered.  FH mother breast cancer. She started getting her mammogram at age 74 years.   Anxiety/depression:  She is doing well on Lexapro 10 mg daily and Wellbutrin XL 300 mg daily. She feels great and GAD: 0 and  PHQ: 0 .  She tried to wean off of Lexapro- but can't do it- she gets grumpy. 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 She is thinking about trying to lower her Wellbutrin over the summer and take it every other day. However, she does not want to stop it because it takes away the desire to smoke and nothing else helps.    Past Medical History:  Diagnosis Date  . ADHD (attention deficit hyperactivity disorder)   . Depression   . Genital warts   . GERD (gastroesophageal reflux disease)   . Hernia, umbilical     Past Surgical History:  Procedure Laterality Date  . CESAREAN SECTION     two  . CHOLECYSTECTOMY    . ENDOMETRIAL ABLATION  2008  . UMBILICAL HERNIA REPAIR      Family History  Problem Relation Age of Onset  . Cancer Other        breast  . Cancer Mother 68       breast   . Breast cancer Mother 44  . Colon cancer Neg Hx   . Esophageal cancer Neg Hx   . Liver cancer Neg Hx   . Pancreatic cancer Neg Hx   . Rectal cancer Neg Hx   . Stomach cancer Neg Hx     Social History   Socioeconomic History  . Marital status: Married    Spouse name: Not on file  . Number of children: 2  . Years of education: Not on file  . Highest education level: Not on file  Occupational History  . Occupation: 3rd grade teacher    Employer: [employer]  Tobacco Use  . Smoking status: Former  Games developer  . Smokeless tobacco: Never Used  Substance and Sexual Activity  . Alcohol use: Yes    Alcohol/week: 0.0 standard drinks    Comment: socially  . Drug use: Never  . Sexual activity: Not on file  Other Topics Concern  . Not on file  Social History Narrative  . Not on file   Social Determinants of Health   Financial Resource Strain:   . Difficulty of Paying Living Expenses:   Food Insecurity:   . Worried About Programme researcher, broadcasting/film/video in the Last Year:   . Barista in the Last Year:   Transportation Needs:   . Freight forwarder (Medical):   Marland Kitchen Lack of Transportation (Non-Medical):   Physical Activity:   . Days of Exercise per Week:   . Minutes of Exercise per Session:   Stress:   . Feeling of Stress :   Social Connections:   . Frequency of Communication with Friends and Family:   . Frequency of Social Gatherings with Friends and Family:   . Attends Religious Services:   . Active Member of Clubs or  Organizations:   . Attends Archivist Meetings:   Marland Kitchen Marital Status:   Intimate Partner Violence:   . Fear of Current or Ex-Partner:   . Emotionally Abused:   Marland Kitchen Physically Abused:   . Sexually Abused:     Outpatient Medications Prior to Visit  Medication Sig Dispense Refill  . omeprazole (PRILOSEC) 20 MG capsule TAKE 2 CAPSULES BY MOUTH ONCE DAILY AS INSTRUCTED 60 capsule prn  . buPROPion (WELLBUTRIN XL) 300 MG 24 hr tablet TAKE 1 TABLET BY MOUTH ONCE DAILY 30 tablet 3  . escitalopram (LEXAPRO) 10 MG tablet TAKE 1 TABLET BY MOUTH ONCE DAILY 30 tablet 3  . Cholecalciferol (VITAMIN D3) 5000 units CAPS Take 2 capsules by mouth daily.  (Patient not taking: Reported on 09/27/2019)     No facility-administered medications prior to visit.    Allergies  Allergen Reactions  . Flagyl [Metronidazole] Nausea Only    ROS Review of Systems  Constitutional: Negative for chills and fever.  HENT: Negative.   Respiratory: Negative.   Cardiovascular: Negative.     Gastrointestinal: Negative.   Genitourinary: Negative.   Musculoskeletal: Negative.   Neurological: Negative.   Psychiatric/Behavioral: Negative.       Objective:    Physical Exam Vitals reviewed.  Constitutional:      Appearance: Normal appearance.  Cardiovascular:     Rate and Rhythm: Normal rate and regular rhythm.     Heart sounds: Normal heart sounds.  Pulmonary:     Effort: Pulmonary effort is normal.     Breath sounds: Normal breath sounds.  Neurological:     General: No focal deficit present.     Mental Status: She is alert and oriented to person, place, and time.  Psychiatric:        Mood and Affect: Mood normal.        Behavior: Behavior normal.        Thought Content: Thought content normal.        Judgment: Judgment normal.     BP 112/70 (BP Location: Left Arm, Patient Position: Sitting, Cuff Size: Normal)   Pulse 89   Temp 97.7 F (36.5 C) (Skin)   Ht 5' 5.75" (1.67 m)   Wt 225 lb (102.1 kg)   SpO2 98%   BMI 36.59 kg/m  Wt Readings from Last 3 Encounters:  09/27/19 225 lb (102.1 kg)  09/06/18 230 lb (104.3 kg)  01/25/18 221 lb 9.6 oz (100.5 kg)     Health Maintenance Due  Topic Date Due  . Hepatitis C Screening  Never done  . COLONOSCOPY  Never done  . MAMMOGRAM  06/27/2017    There are no preventive care reminders to display for this patient.  Lab Results  Component Value Date   TSH 2.12 05/24/2017   Lab Results  Component Value Date   WBC 6.6 08/14/2017   HGB 13.2 08/14/2017   HCT 37.5 08/14/2017   MCV 90.4 08/14/2017   PLT 152 08/14/2017   Lab Results  Component Value Date   NA 138 08/14/2017   K 3.5 08/14/2017   CO2 25 08/14/2017   GLUCOSE 121 (H) 08/14/2017   BUN 21 (H) 08/14/2017   CREATININE 0.97 08/14/2017   BILITOT 0.4 08/14/2017   ALKPHOS 72 08/14/2017   AST 21 08/14/2017   ALT 17 08/14/2017   PROT 7.1 08/14/2017   ALBUMIN 4.6 08/14/2017   CALCIUM 8.8 (L) 08/14/2017   ANIONGAP 9 08/14/2017   GFR 58.92 (L)  05/24/2017  Lab Results  Component Value Date   CHOL 200 04/07/2015   Lab Results  Component Value Date   HDL 43.50 04/07/2015   Lab Results  Component Value Date   LDLCALC 130 (H) 04/07/2015   Lab Results  Component Value Date   TRIG 131.0 04/07/2015   Lab Results  Component Value Date   CHOLHDL 5 04/07/2015   No results found for: HGBA1C    Assessment & Plan:   Problem List Items Addressed This Visit      Other   Depression with anxiety - Primary   Relevant Medications   buPROPion (WELLBUTRIN XL) 300 MG 24 hr tablet   escitalopram (LEXAPRO) 10 MG tablet   Screening mammogram, encounter for   Relevant Orders   MM 3D SCREEN BREAST BILATERAL      Meds ordered this encounter  Medications  . buPROPion (WELLBUTRIN XL) 300 MG 24 hr tablet    Sig: Take 1 tablet (300 mg total) by mouth daily.    Dispense:  30 tablet    Refill:  3    Order Specific Question:   Supervising Provider    Answer:   Dale Palmyra [id number]  . escitalopram (LEXAPRO) 10 MG tablet    Sig: Take 1 tablet (10 mg total) by mouth daily.    Dispense:  30 tablet    Refill:  3    Order Specific Question:   Supervising Provider    Answer:   Dale Perry [[id number]]   She came in for refills for her depression and anxiety.  I refilled her medication.  She is doing well on current regimen but is wondering if she can start lower the Wellbutrin medication because she does not like to be on medication if she does not need it. However, she does not want to stop Wellbutrin because it keeps her from smoking.  I am not sure what the advantage of lowering would be because she is very well maintained on her current regimen.  We can discuss this further at her next appointment.  I will seek the advice of a mental health professional regarding her question.   I have placed an order for your mammogram. Please call and schedule your 3D mammogram as discussed. She is due for a routine CPE with preventative  healthcare labs.  Patient acknowledges this and reports she actually needs to have this done by the end of the month for her insurance plan.    Follow-up: Return in about 2 weeks (around 10/11/2019).   This visit occurred during the SARS-CoV-2 public health emergency.  Safety protocols were in place, including screening questions prior to the visit, additional usage of staff PPE, and extensive cleaning of exam room while observing appropriate contact time as indicated for disinfecting solutions.   Amedeo Kinsman, NP

## 2019-09-27 NOTE — Patient Instructions (Addendum)
It was very nice to meet you today.  I have placed an order for your mammogram. Please call and schedule your 3D mammogram as discussed.   [facility]  [address] [place], [place] [phone]  Please follow up when convenient for your physical exam.

## 2019-09-29 ENCOUNTER — Encounter: Payer: Self-pay | Admitting: Nurse Practitioner

## 2019-10-08 ENCOUNTER — Encounter: Payer: Self-pay | Admitting: Nurse Practitioner

## 2019-10-08 ENCOUNTER — Other Ambulatory Visit: Payer: Self-pay

## 2019-10-08 ENCOUNTER — Ambulatory Visit (INDEPENDENT_AMBULATORY_CARE_PROVIDER_SITE_OTHER): Payer: BC Managed Care – PPO | Admitting: Nurse Practitioner

## 2019-10-08 VITALS — BP 110/68 | HR 80 | Temp 97.6°F | Ht 66.0 in | Wt 225.0 lb

## 2019-10-08 DIAGNOSIS — K219 Gastro-esophageal reflux disease without esophagitis: Secondary | ICD-10-CM | POA: Diagnosis not present

## 2019-10-08 DIAGNOSIS — Z6836 Body mass index (BMI) 36.0-36.9, adult: Secondary | ICD-10-CM | POA: Diagnosis not present

## 2019-10-08 DIAGNOSIS — Z1211 Encounter for screening for malignant neoplasm of colon: Secondary | ICD-10-CM | POA: Diagnosis not present

## 2019-10-08 DIAGNOSIS — Z0000 Encounter for general adult medical examination without abnormal findings: Secondary | ICD-10-CM

## 2019-10-08 NOTE — Patient Instructions (Addendum)
Please see your eye doctor.   Colo guard testing ordered and you will get the kit in the mail.  Shingles vaccine at pharmacy of choice  Mammogram as planned  Anticipatory guidance: Patient counseled regarding regular dental exams q6 months, eye exams yearly- healthy diet and exercise- given sheets.   Referral to GI for GERD and need upper endoscopy   Please return in 6 months   Preventive Care 71-54 Years Old, Female Preventive care refers to visits with your health care provider and lifestyle choices that can promote health and wellness. This includes:  A yearly physical exam. This may also be called an annual well check.  Regular dental visits and eye exams.  Immunizations.  Screening for certain conditions.  Healthy lifestyle choices, such as eating a healthy diet, getting regular exercise, not using drugs or products that contain nicotine and tobacco, and limiting alcohol use. What can I expect for my preventive care visit? Physical exam Your health care provider will check your:  Height and weight. This may be used to calculate body mass index (BMI), which tells if you are at a healthy weight.  Heart rate and blood pressure.  Skin for abnormal spots. Counseling Your health care provider may ask you questions about your:  Alcohol, tobacco, and drug use.  Emotional well-being.  Home and relationship well-being.  Sexual activity.  Eating habits.  Work and work Statistician.  Method of birth control.  Menstrual cycle.  Pregnancy history. What immunizations do I need?  Influenza (flu) vaccine  This is recommended every year. Tetanus, diphtheria, and pertussis (Tdap) vaccine  You may need a Td booster every 10 years. Varicella (chickenpox) vaccine  You may need this if you have not been vaccinated. Zoster (shingles) vaccine  You may need this after age 26. Measles, mumps, and rubella (MMR) vaccine  You may need at least one dose of MMR if you were  born in 1957 or later. You may also need a second dose. Pneumococcal conjugate (PCV13) vaccine  You may need this if you have certain conditions and were not previously vaccinated. Pneumococcal polysaccharide (PPSV23) vaccine  You may need one or two doses if you smoke cigarettes or if you have certain conditions. Meningococcal conjugate (MenACWY) vaccine  You may need this if you have certain conditions. Hepatitis A vaccine  You may need this if you have certain conditions or if you travel or work in places where you may be exposed to hepatitis A. Hepatitis B vaccine  You may need this if you have certain conditions or if you travel or work in places where you may be exposed to hepatitis B. Haemophilus influenzae type b (Hib) vaccine  You may need this if you have certain conditions. Human papillomavirus (HPV) vaccine  If recommended by your health care provider, you may need three doses over 6 months. You may receive vaccines as individual doses or as more than one vaccine together in one shot (combination vaccines). Talk with your health care provider about the risks and benefits of combination vaccines. What tests do I need? Blood tests  Lipid and cholesterol levels. These may be checked every 5 years, or more frequently if you are over 65 years old.  Hepatitis C test.  Hepatitis B test. Screening  Lung cancer screening. You may have this screening every year starting at age 29 if you have a 30-pack-year history of smoking and currently smoke or have quit within the past 15 years.  Colorectal cancer screening. All adults  should have this screening starting at age 48 and continuing until age 86. Your health care provider may recommend screening at age 29 if you are at increased risk. You will have tests every 1-10 years, depending on your results and the type of screening test.  Diabetes screening. This is done by checking your blood sugar (glucose) after you have not eaten  for a while (fasting). You may have this done every 1-3 years.  Mammogram. This may be done every 1-2 years. Talk with your health care provider about when you should start having regular mammograms. This may depend on whether you have a family history of breast cancer.  BRCA-related cancer screening. This may be done if you have a family history of breast, ovarian, tubal, or peritoneal cancers.  Pelvic exam and Pap test. This may be done every 3 years starting at age 72. Starting at age 50, this may be done every 5 years if you have a Pap test in combination with an HPV test. Other tests  Sexually transmitted disease (STD) testing.  Bone density scan. This is done to screen for osteoporosis. You may have this scan if you are at high risk for osteoporosis. Follow these instructions at home: Eating and drinking  Eat a diet that includes fresh fruits and vegetables, whole grains, lean protein, and low-fat dairy.  Take vitamin and mineral supplements as recommended by your health care provider.  Do not drink alcohol if: ? Your health care provider tells you not to drink. ? You are pregnant, may be pregnant, or are planning to become pregnant.  If you drink alcohol: ? Limit how much you have to 0-1 drink a day. ? Be aware of how much alcohol is in your drink. In the [place] one drink equals one 12 oz bottle of beer (355 mL), one 5 oz glass of wine (148 mL), or one 1 oz glass of hard liquor (44 mL). Lifestyle  Take daily care of your teeth and gums.  Stay active. Exercise for at least 30 minutes on 5 or more days each week.  Do not use any products that contain nicotine or tobacco, such as cigarettes, e-cigarettes, and chewing tobacco. If you need help quitting, ask your health care provider.  If you are sexually active, practice safe sex. Use a condom or other form of birth control (contraception) in order to prevent pregnancy and STIs (sexually transmitted infections).  If told by  your health care provider, take low-dose aspirin daily starting at age 73. What's next?  Visit your health care provider once a year for a well check visit.  Ask your health care provider how often you should have your eyes and teeth checked.  Stay up to date on all vaccines. This information is not intended to replace advice given to you by your health care provider. Make sure you discuss any questions you have with your health care provider. Document Revised: 12/14/2017 Document Reviewed: 12/14/2017 Elsevier Patient Education  2020 [employer].

## 2019-10-08 NOTE — Progress Notes (Signed)
Established Patient Office Visit  Subjective:  Patient ID: Krystal Mays, female    DOB: January 20, 1966  Age: 54 y.o. MRN: [id number]  CC:  Chief Complaint  Patient presents with  . Annual Exam    cpe no pap    HPI Krystal Mays presents for a complete physical.  She establish care 09/27/2019 for refills of Wellbutrin and Lexapro.  She is doing well very well with anxiety and depression.  No concerns about ADD.  She is a Engineer, site and out for the summer.  GERD: Patient presents on Prilosec 20 mg daily and has taken this medication for over a decade.  She cannot skip a dose for more than 1 day or she gets an intense esophageal burning between the breast.  Prilosec resolves as quickly.  She has noted no dysphagia, belching, epigastric pain.  No previous GI evaluation for upper endoscopy study.    Obesity: BMI 36: She went on a keto diet in January and lost 16 pounds.  It made her feel crampy so she is off of it now.  She would like to lose more weight this summer.  Wt Readings from Last 3 Encounters:  10/08/19 225 lb (102.1 kg)  09/27/19 225 lb (102.1 kg)  09/06/18 230 lb (104.3 kg)    Immunizations: Up-to-date with the exception of Shingrix. Diet: Healthy Exercise: Plans to become more active Colonoscopy: no Dexa: none  Pap Smear:UTD Mammogram: 10/10/19 Dentist: UTD Vision: UTD  Past Medical History:  Diagnosis Date  . ADHD (attention deficit hyperactivity disorder)   . Depression   . Genital warts   . GERD (gastroesophageal reflux disease)   . Hernia, umbilical     Past Surgical History:  Procedure Laterality Date  . CESAREAN SECTION     two  . CHOLECYSTECTOMY    . ENDOMETRIAL ABLATION  2008  . UMBILICAL HERNIA REPAIR      Family History  Problem Relation Age of Onset  . Cancer Other        breast  . Cancer Mother 35       breast   . Breast cancer Mother 69  . Colon cancer Neg Hx   . Esophageal cancer Neg Hx   . Liver cancer Neg Hx   . Pancreatic  cancer Neg Hx   . Rectal cancer Neg Hx   . Stomach cancer Neg Hx     Social History   Socioeconomic History  . Marital status: Married    Spouse name: Not on file  . Number of children: 2  . Years of education: Not on file  . Highest education level: Not on file  Occupational History  . 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 Smoking status: Former Games developer  . Smokeless tobacco: Never Used  Substance and Sexual Activity  . Alcohol use: Yes    Alcohol/week: 0.0 standard drinks    Comment: socially  . Drug use: Never  . Sexual activity: Not on file  Other Topics Concern  . Not on file  Social History Narrative  . Not on file   Social Determinants of Health   Financial Resource Strain:   . Difficulty of Paying Living Expenses:   Food Insecurity:   . Worried About Programme researcher, broadcasting/film/video in the Last Year:   . Barista in the Last Year:   Transportation Needs:   . Freight forwarder (Medical):   Marland Kitchen Lack  of Transportation (Non-Medical):   Physical Activity:   . Days of Exercise per Week:   . Minutes of Exercise per Session:   Stress:   . Feeling of Stress :   Social Connections:   . Frequency of Communication with Friends and Family:   . Frequency of Social Gatherings with Friends and Family:   . Attends Religious Services:   . Active Member of Clubs or Organizations:   . Attends Banker Meetings:   Marland Kitchen Marital Status:   Intimate Partner Violence:   . Fear of Current or Ex-Partner:   . Emotionally Abused:   Marland Kitchen Physically Abused:   . Sexually Abused:     Outpatient Medications Prior to Visit  Medication Sig Dispense Refill  . buPROPion (WELLBUTRIN XL) 300 MG 24 hr tablet Take 1 tablet (300 mg total) by mouth daily. 30 tablet 3  . escitalopram (LEXAPRO) 10 MG tablet Take 1 tablet (10 mg total) by mouth daily. 30 tablet 3  . omeprazole (PRILOSEC) 20 MG capsule TAKE 2 CAPSULES BY MOUTH ONCE DAILY AS  INSTRUCTED 60 capsule prn   No facility-administered medications prior to visit.    Allergies  Allergen Reactions  . Flagyl [Metronidazole] Nausea Only    ROS Review of Systems  Constitutional: Negative.   HENT: Negative.   Eyes: Negative.   Respiratory: Negative.   Cardiovascular: Negative.   Gastrointestinal:       See HPI GERD  Endocrine: Negative.   Genitourinary: Negative.   Musculoskeletal: Negative.   Skin: Negative.   Allergic/Immunologic: Negative.   Neurological: Negative.   Hematological: Negative.   Psychiatric/Behavioral:       See HPI. No SI/HI.       Objective:    Physical Exam Vitals reviewed.  Constitutional:      Appearance: Normal appearance. She is obese.  HENT:     Head: Normocephalic and atraumatic.     Right Ear: Tympanic membrane normal.     Left Ear: Tympanic membrane normal.  Eyes:     Conjunctiva/sclera: Conjunctivae normal.     Pupils: Pupils are equal, round, and reactive to light.  Cardiovascular:     Rate and Rhythm: Normal rate and regular rhythm.     Pulses: Normal pulses.     Heart sounds: Normal heart sounds.  Pulmonary:     Effort: Pulmonary effort is normal.     Breath sounds: Normal breath sounds.  Abdominal:     Palpations: Abdomen is soft.     Tenderness: There is no abdominal tenderness.  Musculoskeletal:        General: Normal range of motion.     Cervical back: Normal range of motion and neck supple.  Skin:    General: Skin is warm and dry.  Neurological:     General: No focal deficit present.     Mental Status: She is alert and oriented to person, place, and time.  Psychiatric:        Mood and Affect: Mood normal.        Behavior: Behavior normal.        Thought Content: Thought content normal.        Judgment: Judgment normal.     BP 110/68 (BP Location: Left Arm, Patient Position: Sitting, Cuff Size: Large)   Pulse 80   Temp 97.6 F (36.4 C) (Skin)   Ht 5\' 6"  (1.676 m)   Wt 225 lb (102.1 kg)    SpO2 98%   BMI 36.32 kg/m  Wt  Readings from Last 3 Encounters:  10/08/19 225 lb (102.1 kg)  09/27/19 225 lb (102.1 kg)  09/06/18 230 lb (104.3 kg)     Health Maintenance Due  Topic Date Due  . Hepatitis C Screening  Never done  . COLONOSCOPY  Never done  . MAMMOGRAM  06/27/2017    There are no preventive care reminders to display for this patient.  Lab Results  Component Value Date   TSH 2.12 05/24/2017   Lab Results  Component Value Date   WBC 6.6 08/14/2017   HGB 13.2 08/14/2017   HCT 37.5 08/14/2017   MCV 90.4 08/14/2017   PLT 152 08/14/2017   Lab Results  Component Value Date   NA 138 08/14/2017   K 3.5 08/14/2017   CO2 25 08/14/2017   GLUCOSE 121 (H) 08/14/2017   BUN 21 (H) 08/14/2017   CREATININE 0.97 08/14/2017   BILITOT 0.4 08/14/2017   ALKPHOS 72 08/14/2017   AST 21 08/14/2017   ALT 17 08/14/2017   PROT 7.1 08/14/2017   ALBUMIN 4.6 08/14/2017   CALCIUM 8.8 (L) 08/14/2017   ANIONGAP 9 08/14/2017   GFR 58.92 (L) 05/24/2017   Lab Results  Component Value Date   CHOL 200 04/07/2015   Lab Results  Component Value Date   HDL 43.50 04/07/2015   Lab Results  Component Value Date   LDLCALC 130 (H) 04/07/2015   Lab Results  Component Value Date   TRIG 131.0 04/07/2015   Lab Results  Component Value Date   CHOLHDL 5 04/07/2015   No results found for: HGBA1C    Assessment & Plan:   Problem List Items Addressed This Visit      Digestive   GERD (gastroesophageal reflux disease)   Relevant Orders   Ambulatory referral to Gastroenterology     Other   Adult BMI 36.0-36.9 kg/sq m    Other Visit Diagnoses    Colon cancer screening    -  Primary   Relevant Orders   Cologuard   Encounter for preventative adult health care examination       Relevant Orders   CBC with Differential/Platelet   TSH   Comprehensive metabolic panel   Hemoglobin A1c   Lipid panel   VITAMIN D 25 Hydroxy (Vit-D Deficiency, Fractures)   Hepatitis C Antibody        No orders of the defined types were placed in this encounter.  Shingles vaccine at pharmacy of choice  Mammogram as planned  Anticipatory guidance: Patient counseled regarding regular dental exams q6 months, eye exams yearly- healthy diet and exercise- given [facility] - glycemic Index sheet and Dr. Lupita Dawn recommendation for low carb diet sheets.  We discussed colon cancer screening with Cologuard versus colonoscopy.  Referral to GI for GERD and need upper endoscopy as patient is unable to wean down her Prilosec over a decade.  Addendum: Patient calls back and decided she does not want to do Cologuard.  She would prefer to have a colonoscopy instead.  I canceled the Cologuard order.  She already has referral to GI.   Please return in 6 months  Follow-up: Return in about 6 months (around 04/08/2020).  This visit occurred during the SARS-CoV-2 public health emergency.  Safety protocols were in place, including screening questions prior to the visit, additional usage of staff PPE, and extensive cleaning of exam room while observing appropriate contact time as indicated for disinfecting solutions.    Denice Paradise, NP

## 2019-10-09 LAB — CBC WITH DIFFERENTIAL/PLATELET
Basophils Absolute: 0.1 10*3/uL (ref 0.0–0.1)
Basophils Relative: 1.1 % (ref 0.0–3.0)
Eosinophils Absolute: 0.1 10*3/uL (ref 0.0–0.7)
Eosinophils Relative: 1.5 % (ref 0.0–5.0)
HCT: 37.7 % (ref 36.0–46.0)
Hemoglobin: 13.2 g/dL (ref 12.0–15.0)
Lymphocytes Relative: 33.8 % (ref 12.0–46.0)
Lymphs Abs: 1.9 10*3/uL (ref 0.7–4.0)
MCHC: 35.1 g/dL (ref 30.0–36.0)
MCV: 91.4 fl (ref 78.0–100.0)
Monocytes Absolute: 0.5 10*3/uL (ref 0.1–1.0)
Monocytes Relative: 8.4 % (ref 3.0–12.0)
Neutro Abs: 3.1 10*3/uL (ref 1.4–7.7)
Neutrophils Relative %: 55.2 % (ref 43.0–77.0)
Platelets: 168 10*3/uL (ref 150.0–400.0)
RBC: 4.12 Mil/uL (ref 3.87–5.11)
RDW: 13.2 % (ref 11.5–15.5)
WBC: 5.7 10*3/uL (ref 4.0–10.5)

## 2019-10-09 LAB — COMPREHENSIVE METABOLIC PANEL
ALT: 12 U/L (ref 0–35)
AST: 14 U/L (ref 0–37)
Albumin: 4.6 g/dL (ref 3.5–5.2)
Alkaline Phosphatase: 64 U/L (ref 39–117)
BUN: 16 mg/dL (ref 6–23)
CO2: 25 mEq/L (ref 19–32)
Calcium: 9.2 mg/dL (ref 8.4–10.5)
Chloride: 106 mEq/L (ref 96–112)
Creatinine, Ser: 1.05 mg/dL (ref 0.40–1.20)
GFR: 54.92 mL/min — ABNORMAL LOW (ref >60.00)
Glucose, Bld: 100 mg/dL — ABNORMAL HIGH (ref 70–99)
Potassium: 3.7 mEq/L (ref 3.5–5.1)
Sodium: 137 mEq/L (ref 135–145)
Total Bilirubin: 0.5 mg/dL (ref 0.2–1.2)
Total Protein: 6.7 g/dL (ref 6.0–8.3)

## 2019-10-09 LAB — LIPID PANEL
Cholesterol: 202 mg/dL — ABNORMAL HIGH (ref 0–200)
HDL: 41.5 mg/dL (ref >39.00)
NonHDL: 160.99
Total CHOL/HDL Ratio: 5
Triglycerides: 201 mg/dL — ABNORMAL HIGH (ref 0.0–149.0)
VLDL: 40.2 mg/dL — ABNORMAL HIGH (ref 0.0–40.0)

## 2019-10-09 LAB — HEPATITIS C ANTIBODY
Hepatitis C Ab: NONREACTIVE
SIGNAL TO CUT-OFF: 0.01 (ref <1.00)

## 2019-10-09 LAB — TSH: TSH: 1.31 u[IU]/mL (ref 0.35–4.50)

## 2019-10-09 LAB — VITAMIN D 25 HYDROXY (VIT D DEFICIENCY, FRACTURES): VITD: 21.62 ng/mL — ABNORMAL LOW (ref 30.00–100.00)

## 2019-10-09 LAB — HEMOGLOBIN A1C: Hgb A1c MFr Bld: 5.2 % (ref 4.6–6.5)

## 2019-10-09 LAB — LDL CHOLESTEROL, DIRECT: Direct LDL: 132 mg/dL

## 2019-10-09 NOTE — Telephone Encounter (Signed)
Pt is following up because she hasn't heard anything from her message she sent yesterday. I explained we are seeing patients and to give you some time to respond. Pt said she will reach out again tomorrow. Her main concern is she want to cancel colorguard and get a colonoscopy scheduled. She would like a call back.

## 2019-10-10 ENCOUNTER — Ambulatory Visit
Admission: RE | Admit: 2019-10-10 | Discharge: 2019-10-10 | Disposition: A | Discharge status: Home / Self Care | Payer: BC Managed Care – PPO | Source: Ambulatory Visit | Attending: Nurse Practitioner | Admitting: Nurse Practitioner

## 2019-10-10 DIAGNOSIS — Z1231 Encounter for screening mammogram for malignant neoplasm of breast: Secondary | ICD-10-CM

## 2019-10-11 ENCOUNTER — Telehealth: Payer: Self-pay

## 2019-10-11 NOTE — Telephone Encounter (Signed)
Attempted to call in regards to lab results. No answer no voicemail.

## 2019-11-29 ENCOUNTER — Other Ambulatory Visit: Payer: Self-pay

## 2019-11-29 MED ORDER — OMEPRAZOLE 20 MG PO CPDR: DELAYED_RELEASE_CAPSULE | ORAL | 99 refills | Status: DC

## 2019-12-26 ENCOUNTER — Ambulatory Visit: Payer: BC Managed Care – PPO | Admitting: Gastroenterology

## 2019-12-26 ENCOUNTER — Other Ambulatory Visit: Payer: Self-pay

## 2019-12-26 ENCOUNTER — Encounter: Payer: Self-pay | Admitting: Gastroenterology

## 2019-12-26 VITALS — BP 109/71 | HR 66 | Temp 98.1°F | Ht 64.0 in | Wt 222.2 lb

## 2019-12-26 DIAGNOSIS — Z1211 Encounter for screening for malignant neoplasm of colon: Secondary | ICD-10-CM

## 2019-12-26 DIAGNOSIS — K219 Gastro-esophageal reflux disease without esophagitis: Secondary | ICD-10-CM

## 2019-12-26 MED ORDER — FAMOTIDINE 20 MG PO TABS: 20.0000 mg | ORAL_TABLET | Freq: Every day | ORAL | 2 refills | Status: DC

## 2019-12-26 MED ORDER — PEG 3350-KCL-NA BICARB-NACL 420 G PO SOLR: ORAL | 0 refills | Status: DC

## 2019-12-26 NOTE — Patient Instructions (Addendum)
Stop taking Prilosec and start taking Pepcid 20 MG 1 tablet daily.  Gastroesophageal Reflux Disease, Adult Gastroesophageal reflux (GER) happens when acid from the stomach flows up into the tube that connects the mouth and the stomach (esophagus). Normally, food travels down the esophagus and stays in the stomach to be digested. However, when a person has GER, food and stomach acid sometimes move back up into the esophagus. If this becomes a more serious problem, the person may be diagnosed with a disease called gastroesophageal reflux disease (GERD). GERD occurs when the reflux:  Happens often.  Causes frequent or severe symptoms.  Causes problems such as damage to the esophagus. When stomach acid comes in contact with the esophagus, the acid may cause soreness (inflammation) in the esophagus. Over time, GERD may create small holes (ulcers) in the lining of the esophagus. What are the causes? This condition is caused by a problem with the muscle between the esophagus and the stomach (lower esophageal sphincter, or LES). Normally, the LES muscle closes after food passes through the esophagus to the stomach. When the LES is weakened or abnormal, it does not close properly, and that allows food and stomach acid to go back up into the esophagus. The LES can be weakened by certain dietary substances, medicines, and medical conditions, including:  Tobacco use.  Pregnancy.  Having a hiatal hernia.  Alcohol use.  Certain foods and beverages, such as coffee, chocolate, onions, and peppermint. What increases the risk? You are more likely to develop this condition if you:  Have an increased body weight.  Have a connective tissue disorder.  Use NSAID medicines. What are the signs or symptoms? Symptoms of this condition include:  Heartburn.  Difficult or painful swallowing.  The feeling of having a lump in the throat.  Abitter taste in the mouth.  Bad breath.  Having a large amount  of saliva.  Having an upset or bloated stomach.  Belching.  Chest pain. Different conditions can cause chest pain. Make sure you see your health care provider if you experience chest pain.  Shortness of breath or wheezing.  Ongoing (chronic) cough or a night-time cough.  Wearing away of tooth enamel.  Weight loss. How is this diagnosed? Your health care provider will take a medical history and perform a physical exam. To determine if you have mild or severe GERD, your health care provider may also monitor how you respond to treatment. You may also have tests, including:  A test to examine your stomach and esophagus with a small camera (endoscopy).  A test thatmeasures the acidity level in your esophagus.  A test thatmeasures how much pressure is on your esophagus.  A barium swallow or modified barium swallow test to show the shape, size, and functioning of your esophagus. How is this treated? The goal of treatment is to help relieve your symptoms and to prevent complications. Treatment for this condition may vary depending on how severe your symptoms are. Your health care provider may recommend:  Changes to your diet.  Medicine.  Surgery. Follow these instructions at home: Eating and drinking   Follow a diet as recommended by your health care provider. This may involve avoiding foods and drinks such as: ? Coffee and tea (with or without caffeine). ? Drinks that containalcohol. ? Energy drinks and sports drinks. ? Carbonated drinks or sodas. ? Chocolate and cocoa. ? Peppermint and mint flavorings. ? Garlic and onions. ? Horseradish. ? Spicy and acidic foods, including peppers, chili powder, curry  powder, vinegar, hot sauces, and barbecue sauce. ? Citrus fruit juices and citrus fruits, such as oranges, lemons, and limes. ? Tomato-based foods, such as red sauce, chili, salsa, and pizza with red sauce. ? Fried and fatty foods, such as donuts, french fries, potato  chips, and high-fat dressings. ? High-fat meats, such as hot dogs and fatty cuts of red and white meats, such as rib eye steak, sausage, ham, and bacon. ? High-fat dairy items, such as whole milk, butter, and cream cheese.  Eat small, frequent meals instead of large meals.  Avoid drinking large amounts of liquid with your meals.  Avoid eating meals during the 2-3 hours before bedtime.  Avoid lying down right after you eat.  Do not exercise right after you eat. Lifestyle   Do not use any products that contain nicotine or tobacco, such as cigarettes, e-cigarettes, and chewing tobacco. If you need help quitting, ask your health care provider.  Try to reduce your stress by using methods such as yoga or meditation. If you need help reducing stress, ask your health care provider.  If you are overweight, reduce your weight to an amount that is healthy for you. Ask your health care provider for guidance about a safe weight loss goal. General instructions  Pay attention to any changes in your symptoms.  Take over-the-counter and prescription medicines only as told by your health care provider. Do not take aspirin, ibuprofen, or other NSAIDs unless your health care provider told you to do so.  Wear loose-fitting clothing. Do not wear anything tight around your waist that causes pressure on your abdomen.  Raise (elevate) the head of your bed about 6 inches (15 cm).  Avoid bending over if this makes your symptoms worse.  Keep all follow-up visits as told by your health care provider. This is important. Contact a health care provider if:  You have: ? New symptoms. ? Unexplained weight loss. ? Difficulty swallowing or it hurts to swallow. ? Wheezing or a persistent cough. ? A hoarse voice.  Your symptoms do not improve with treatment. Get help right away if you:  Have pain in your arms, neck, jaw, teeth, or back.  Feel sweaty, dizzy, or light-headed.  Have chest pain or shortness  of breath.  Vomit and your vomit looks like blood or coffee grounds.  Faint.  Have stool that is bloody or black.  Cannot swallow, drink, or eat. Summary  Gastroesophageal reflux happens when acid from the stomach flows up into the esophagus. GERD is a disease in which the reflux happens often, causes frequent or severe symptoms, or causes problems such as damage to the esophagus.  Treatment for this condition may vary depending on how severe your symptoms are. Your health care provider may recommend diet and lifestyle changes, medicine, or surgery.  Contact a health care provider if you have new or worsening symptoms.  Take over-the-counter and prescription medicines only as told by your health care provider. Do not take aspirin, ibuprofen, or other NSAIDs unless your health care provider told you to do so.  Keep all follow-up visits as told by your health care provider. This is important. This information is not intended to replace advice given to you by your health care provider. Make sure you discuss any questions you have with your health care provider. Document Revised: 10/11/2017 Document Reviewed: 10/11/2017 Elsevier Patient Education  2020 [employer].

## 2019-12-26 NOTE — Progress Notes (Signed)
Krystal Mays [address]  [address]  [place], [place] [address]  Main: [phone]  Fax: [phone]   Gastroenterology Consultation  Referring Provider:     Theadore Nan, NP Primary Care Physician:  Theadore Nan, NP Reason for Consultation:    Reflux        HPI:   Chief complaint: Heartburn  Krystal Mays is a 54 y.o. y/o female referred for consultation & management  by Dr. Arvilla Market, Ranae Plumber, NP.  Patient reports chronic history of reflux and has been on Prilosec daily for over 10 years with good control.  However, unable to come off the medication his symptoms returned within 1 day of skipping the dose. The patient denies abdominal or flank pain, anorexia, nausea or vomiting, dysphagia, change in bowel habits or black or bloody stools or weight loss.  No family history of colon cancer.  No prior EGD or colonoscopy.  Past Medical History:  Diagnosis Date  . ADHD (attention deficit hyperactivity disorder)   . Depression   . Genital warts   . GERD (gastroesophageal reflux disease)   . Hernia, umbilical     Past Surgical History:  Procedure Laterality Date  . CESAREAN SECTION     two  . CHOLECYSTECTOMY    . ENDOMETRIAL ABLATION  2008  . UMBILICAL HERNIA REPAIR      Prior to Admission medications   Medication Sig Start Date End Date Taking? Authorizing Provider  buPROPion (WELLBUTRIN XL) 300 MG 24 hr tablet Take 1 tablet (300 mg total) by mouth daily. 09/27/19   Theadore Nan, NP  escitalopram (LEXAPRO) 10 MG tablet Take 1 tablet (10 mg total) by mouth daily. 09/27/19   Theadore Nan, NP  omeprazole (PRILOSEC) 20 MG capsule Take 2 capsules by mouth daily as instructed 11/29/19   Theadore Nan, NP    Family History  Problem Relation Age of Onset  . Cancer Other        breast  . Cancer Mother 75       breast   . Breast cancer Mother 82  . Colon cancer Neg Hx   . Esophageal cancer Neg Hx   . Liver cancer Neg Hx   . Pancreatic  cancer Neg Hx   . Rectal cancer Neg Hx   . Stomach cancer Neg Hx      Social History   Tobacco Use  . Smoking status: Former Games developer  . Smokeless tobacco: Never Used  Substance Use Topics  . Alcohol use: Yes    Alcohol/week: 0.0 standard drinks    Comment: socially  . Drug use: Never    Allergies as of 12/26/2019 - Review Complete 10/08/2019  Allergen Reaction Noted  . Flagyl [metronidazole] Nausea Only 05/21/2014    Review of Systems:    All systems reviewed and negative except where noted in HPI.   Physical Exam:   Vitals:   12/26/19 1525  BP: 109/71  Pulse: 66  Temp: 98.1 F (36.7 C)  TempSrc: Oral  Weight: 222 lb 3.2 oz (100.8 kg)  Height: 5\' 4"  (1.626 m)    No LMP recorded. Patient has had an ablation. Psych:  Alert and cooperative. Normal mood and affect. General:   Alert,  Well-developed, well-nourished, pleasant and cooperative in NAD Head:  Normocephalic and atraumatic. Eyes:  Sclera clear, no icterus.   Conjunctiva pink. Ears:  Normal auditory acuity. Nose:  No deformity, discharge, or lesions. Mouth:  No deformity or lesions,oropharynx pink &  moist. Neck:  Supple; no masses or thyromegaly. Abdomen:  Normal bowel sounds.  No bruits.  Soft, non-tender and non-distended without masses, hepatosplenomegaly or hernias noted.  No guarding or rebound tenderness.    Msk:  Symmetrical without gross deformities. Good, equal movement & strength bilaterally. Pulses:  Normal pulses noted. Extremities:  No clubbing or edema.  No cyanosis. Neurologic:  Alert and oriented x3;  grossly normal neurologically. Skin:  Intact without significant lesions or rashes. No jaundice. Lymph Nodes:  No significant cervical adenopathy. Psych:  Alert and cooperative. Normal mood and affect.   Labs: CBC    Component Value Date/Time   WBC 5.7 10/08/2019 1553   RBC 4.12 10/08/2019 1553   HGB 13.2 10/08/2019 1553   HGB 13.4 02/25/2014 1805   HCT 37.7 10/08/2019 1553   HCT 38.6  02/25/2014 1805   PLT 168.0 10/08/2019 1553   PLT 177 02/25/2014 1805   MCV 91.4 10/08/2019 1553   MCV 93 02/25/2014 1805   MCH 31.9 08/14/2017 1900   MCHC 35.1 10/08/2019 1553   RDW 13.2 10/08/2019 1553   RDW 13.0 02/25/2014 1805   LYMPHSABS 1.9 10/08/2019 1553   LYMPHSABS 2.1 02/25/2014 1805   MONOABS 0.5 10/08/2019 1553   MONOABS 0.6 02/25/2014 1805   EOSABS 0.1 10/08/2019 1553   EOSABS 0.1 02/25/2014 1805   BASOSABS 0.1 10/08/2019 1553   BASOSABS 0.0 02/25/2014 1805   CMP     Component Value Date/Time   NA 137 10/08/2019 1553   NA 142 02/25/2014 1805   K 3.7 10/08/2019 1553   K 3.7 02/25/2014 1805   CL 106 10/08/2019 1553   CL 107 02/25/2014 1805   CO2 25 10/08/2019 1553   CO2 27 02/25/2014 1805   GLUCOSE 100 (H) 10/08/2019 1553   GLUCOSE 89 02/25/2014 1805   BUN 16 10/08/2019 1553   BUN 18 02/25/2014 1805   CREATININE 1.05 10/08/2019 1553   CREATININE 0.92 02/25/2014 1805   CALCIUM 9.2 10/08/2019 1553   CALCIUM 8.3 (L) 02/25/2014 1805   PROT 6.7 10/08/2019 1553   PROT 7.0 02/25/2014 1805   ALBUMIN 4.6 10/08/2019 1553   ALBUMIN 3.9 02/25/2014 1805   AST 14 10/08/2019 1553   AST 10 (L) 02/25/2014 1805   ALT 12 10/08/2019 1553   ALT 15 02/25/2014 1805   ALKPHOS 64 10/08/2019 1553   ALKPHOS 76 02/25/2014 1805   BILITOT 0.5 10/08/2019 1553   BILITOT 0.5 02/25/2014 1805   GFRNONAA >60 08/14/2017 1900   GFRNONAA >60 02/25/2014 1805   GFRAA >60 08/14/2017 1900   GFRAA >60 02/25/2014 1805    Imaging Studies: No results found.  Assessment and Plan:   Krystal Mays is a 54 y.o. y/o female has been referred for reflux  Given that patient is about 54 years of age, with obesity, Caucasian, she has multiple risk factors which indicate screening for Barrett's at this time  She is also due for screening colonoscopy  I have discussed alternative options, risks & benefits,  which include, but are not limited to, bleeding, infection, perforation,respiratory  complication & drug reaction.  The patient agrees with this plan & written consent will be obtained.    Patient educated extensively on acid reflux lifestyle modification, including buying a bed wedge, not eating 3 hrs before bedtime, diet modifications, and handout given for the same.   Pepcid as alternative to Prilosec discussed and patient willing to try this  Risks of PPI use were discussed with patient including  bone loss, C. Diff diarrhea, pneumonia, infections, CKD, electrolyte abnormalities.      Dr Krystal Mays  Speech recognition software was used to dictate the above note.

## 2019-12-26 NOTE — Addendum Note (Signed)
Addended by: Adela Ports on: 12/26/2019 04:10 PM   Modules accepted: Orders

## 2020-01-30 ENCOUNTER — Other Ambulatory Visit: Payer: Self-pay | Admitting: Nurse Practitioner

## 2020-02-25 ENCOUNTER — Other Ambulatory Visit
Admission: RE | Admit: 2020-02-25 | Discharge: 2020-02-25 | Disposition: A | Discharge status: Home / Self Care | Payer: BC Managed Care – PPO | Source: Ambulatory Visit | Attending: Gastroenterology | Admitting: Gastroenterology

## 2020-02-25 ENCOUNTER — Other Ambulatory Visit: Payer: Self-pay

## 2020-02-25 DIAGNOSIS — K6289 Other specified diseases of anus and rectum: Secondary | ICD-10-CM | POA: Diagnosis not present

## 2020-02-25 DIAGNOSIS — Z87891 Personal history of nicotine dependence: Secondary | ICD-10-CM | POA: Diagnosis not present

## 2020-02-25 DIAGNOSIS — K648 Other hemorrhoids: Secondary | ICD-10-CM | POA: Diagnosis not present

## 2020-02-25 DIAGNOSIS — Z20822 Contact with and (suspected) exposure to covid-19: Secondary | ICD-10-CM | POA: Insufficient documentation

## 2020-02-25 DIAGNOSIS — Z01818 Encounter for other preprocedural examination: Secondary | ICD-10-CM | POA: Insufficient documentation

## 2020-02-25 DIAGNOSIS — K5730 Diverticulosis of large intestine without perforation or abscess without bleeding: Secondary | ICD-10-CM | POA: Diagnosis not present

## 2020-02-25 DIAGNOSIS — K644 Residual hemorrhoidal skin tags: Secondary | ICD-10-CM | POA: Diagnosis not present

## 2020-02-25 DIAGNOSIS — Z1211 Encounter for screening for malignant neoplasm of colon: Secondary | ICD-10-CM | POA: Diagnosis present

## 2020-02-25 DIAGNOSIS — Z79899 Other long term (current) drug therapy: Secondary | ICD-10-CM | POA: Diagnosis not present

## 2020-02-25 LAB — SARS CORONAVIRUS 2 (TAT 6-24 HRS): SARS Coronavirus 2: NEGATIVE

## 2020-02-26 ENCOUNTER — Telehealth: Payer: Self-pay

## 2020-02-26 NOTE — Telephone Encounter (Signed)
Returned patients call. Informed patient to notify the Endo unit in the morning about canceling the EGD. I called the Endo unit to cancel but the scheduler was gone. LVM

## 2020-02-27 ENCOUNTER — Other Ambulatory Visit: Payer: Self-pay

## 2020-02-27 ENCOUNTER — Encounter: Payer: Self-pay | Admitting: Gastroenterology

## 2020-02-27 ENCOUNTER — Ambulatory Visit: Payer: BC Managed Care – PPO | Admitting: Registered Nurse

## 2020-02-27 ENCOUNTER — Ambulatory Visit
Admission: RE | Admit: 2020-02-27 | Discharge: 2020-02-27 | Disposition: A | Discharge status: Home / Self Care | Payer: BC Managed Care – PPO | Attending: Gastroenterology | Admitting: Gastroenterology

## 2020-02-27 ENCOUNTER — Encounter
Admission: RE | Disposition: A | Discharge status: Home / Self Care | Payer: Self-pay | Source: Home / Self Care | Attending: Gastroenterology

## 2020-02-27 DIAGNOSIS — K5730 Diverticulosis of large intestine without perforation or abscess without bleeding: Secondary | ICD-10-CM | POA: Insufficient documentation

## 2020-02-27 DIAGNOSIS — Z1211 Encounter for screening for malignant neoplasm of colon: Secondary | ICD-10-CM | POA: Diagnosis not present

## 2020-02-27 DIAGNOSIS — Z20822 Contact with and (suspected) exposure to covid-19: Secondary | ICD-10-CM | POA: Insufficient documentation

## 2020-02-27 DIAGNOSIS — Z79899 Other long term (current) drug therapy: Secondary | ICD-10-CM | POA: Insufficient documentation

## 2020-02-27 DIAGNOSIS — K644 Residual hemorrhoidal skin tags: Secondary | ICD-10-CM | POA: Insufficient documentation

## 2020-02-27 DIAGNOSIS — Z1231 Encounter for screening mammogram for malignant neoplasm of breast: Secondary | ICD-10-CM

## 2020-02-27 DIAGNOSIS — K648 Other hemorrhoids: Secondary | ICD-10-CM | POA: Insufficient documentation

## 2020-02-27 DIAGNOSIS — K219 Gastro-esophageal reflux disease without esophagitis: Secondary | ICD-10-CM

## 2020-02-27 DIAGNOSIS — Z87891 Personal history of nicotine dependence: Secondary | ICD-10-CM | POA: Insufficient documentation

## 2020-02-27 DIAGNOSIS — K6289 Other specified diseases of anus and rectum: Secondary | ICD-10-CM | POA: Insufficient documentation

## 2020-02-27 HISTORY — PX: COLONOSCOPY WITH PROPOFOL: SHX5780

## 2020-02-27 SURGERY — COLONOSCOPY WITH PROPOFOL
Anesthesia: General

## 2020-02-27 MED ORDER — PROPOFOL 10 MG/ML IV BOLUS
INTRAVENOUS | Status: DC | PRN
  Administered 2020-02-27: 30 mg via INTRAVENOUS
  Administered 2020-02-27: 80 mg via INTRAVENOUS
  Administered 2020-02-27: 30 mg via INTRAVENOUS

## 2020-02-27 MED ORDER — PROPOFOL 500 MG/50ML IV EMUL
INTRAVENOUS | Status: DC | PRN
  Administered 2020-02-27: 150 ug/kg/min via INTRAVENOUS

## 2020-02-27 MED ORDER — PHENYLEPHRINE HCL (PRESSORS) 10 MG/ML IV SOLN
INTRAVENOUS | Status: AC
  Filled 2020-02-27: qty 1

## 2020-02-27 MED ORDER — SODIUM CHLORIDE 0.9 % IV SOLN: INTRAVENOUS | Status: DC

## 2020-02-27 MED ORDER — PROPOFOL 500 MG/50ML IV EMUL
INTRAVENOUS | Status: AC
  Filled 2020-02-27: qty 150

## 2020-02-27 NOTE — Op Note (Signed)
[facility] Gastroenterology Patient Name: Krystal PippinKristi Mays Procedure Date: 02/27/2020 7:46 AM MRN: [id number] Account #: [id number] Date of Birth: 01/13/1966 Admit Type: Outpatient Age: [age] Room: [facility] ENDO ROOM 2 Gender: Female Note Status: Finalized Procedure:             Colonoscopy Indications:           Screening for colorectal malignant neoplasm Providers:             Leeasia Secrist B. Maximino Greenlandahiliani MD, MD Medicines:             Monitored Anesthesia Care Complications:         No immediate complications. Procedure:             Pre-Anesthesia Assessment:                        - Prior to the procedure, a History and Physical was                         performed, and patient medications, allergies and                         sensitivities were reviewed. The patient's tolerance                         of previous anesthesia was reviewed.                        - The risks and benefits of the procedure and the                         sedation options and risks were discussed with the                         patient. All questions were answered and informed                         consent was obtained.                        - Patient identification and proposed procedure were                         verified prior to the procedure by the physician, the                         nurse, the anesthetist and the technician. The                         procedure was verified in the pre-procedure area in                         the procedure room in the endoscopy suite.                        - ASA Grade Assessment: II - A patient with mild                         systemic disease.                        -  After reviewing the risks and benefits, the patient                         was deemed in satisfactory condition to undergo the                         procedure.                        After obtaining informed consent, the colonoscope was                         passed under  direct vision. Throughout the procedure,                         the patient's blood pressure, pulse, and oxygen                         saturations were monitored continuously. The                         Colonoscope was introduced through the anus and                         advanced to the the cecum, identified by appendiceal                         orifice and ileocecal valve. The colonoscopy was                         performed with ease. The patient tolerated the                         procedure well. The quality of the bowel preparation                         was good. Findings:      The perianal exam findings include non-thrombosed external hemorrhoids.      Multiple diverticula were found in the sigmoid colon.      The exam was otherwise without abnormality.      The rectum, sigmoid colon, descending colon, transverse colon, ascending       colon and cecum appeared normal.      Anal papilla(e) were hypertrophied.      Non-bleeding internal hemorrhoids were found during retroflexion. Impression:            - Non-thrombosed external hemorrhoids found on                         perianal exam.                        - Diverticulosis in the sigmoid colon.                        - The examination was otherwise normal.                        - The rectum, sigmoid colon, descending colon,  transverse colon, ascending colon and cecum are normal.                        - Anal papilla(e) were hypertrophied.                        - Non-bleeding internal hemorrhoids.                        - No specimens collected. Recommendation:        - Discharge patient to home.                        - Resume previous diet.                        - Continue present medications.                        - Repeat colonoscopy in 10 years for screening                         purposes.                        - Return to primary care physician as previously                          scheduled.                        - The findings and recommendations were discussed with                         the patient.                        - The findings and recommendations were discussed with                         the patient's family.                        - High fiber diet.                        - In the future, if patient develops new symptoms such                         as blood per rectum, abdominal pain, weight loss,                         altered bowel habits or any other reason for concern,                         patient should discuss this with thier PCP as they may                         need a GI referral at that time or evaluation for need  for colonoscopy earlier than the recommended screening                         colonoscopy.                        In addition, if patient's family history of colon                         cancer changes (no family history at this time) in the                         future, earlier screening may be indicated and patient                         should discuss this with PCP as well. Procedure Code(s):     --- Professional ---                        [phone], Colonoscopy, flexible; diagnostic, including                         collection of specimen(s) by brushing or washing, when                         performed (separate procedure) Diagnosis Code(s):     --- Professional ---                        Z12.11, Encounter for screening for malignant neoplasm                         of colon CPT copyright 2019 American Medical Association. All rights reserved. The codes documented in this report are preliminary and upon coder review may  be revised to meet current compliance requirements.  Melodie Bouillon, MD Michel Bickers B. Maximino Greenland MD, MD 02/27/2020 9:09:00 AM This report has been signed electronically. Number of Addenda: 0 Note Initiated On: 02/27/2020 7:46 AM Scope Withdrawal Time: 0 hours 13 minutes  17 seconds  Total Procedure Duration: 0 hours 21 minutes 8 seconds       [facility]

## 2020-02-27 NOTE — Transfer of Care (Signed)
Immediate Anesthesia Transfer of Care Note  Patient: MATILDA FLEIG  Procedure(s) Performed: COLONOSCOPY WITH PROPOFOL (N/A )  Patient Location: PACU  Anesthesia Type:General  Level of Consciousness: sedated  Airway & Oxygen Therapy: Patient Spontanous Breathing and Patient connected to nasal cannula oxygen  Post-op Assessment: Report given to RN and Post -op Vital signs reviewed and stable  Post vital signs: Reviewed and stable  Last Vitals:  Vitals Value Taken Time  BP    Temp    Pulse 76 02/27/20 0910  Resp 22 02/27/20 0910  SpO2 96 % 02/27/20 0910  Vitals shown include unvalidated device data.  Last Pain:  Vitals:   02/27/20 0818  TempSrc: Temporal  PainSc: 0-No pain         Complications: No complications documented.

## 2020-02-27 NOTE — H&P (Signed)
Krystal Bouillon, MD [address], [place], [place], [address] [address], [address], [place], [place], [address] Phone: [phone]  Fax: [phone]  Primary Care Physician:  Theadore Nan, NP   Pre-Procedure History & Physical: HPI:  Krystal Mays is a 54 y.o. female is here for a colonoscopy.   Past Medical History:  Diagnosis Date  . ADHD (attention deficit hyperactivity disorder)   . Depression   . Genital warts   . GERD (gastroesophageal reflux disease)   . Hernia, umbilical     Past Surgical History:  Procedure Laterality Date  . CESAREAN SECTION     two  . CHOLECYSTECTOMY    . ENDOMETRIAL ABLATION  2008  . UMBILICAL HERNIA REPAIR      Prior to Admission medications   Medication Sig Start Date End Date Taking? Authorizing Provider  buPROPion (WELLBUTRIN XL) 300 MG 24 hr tablet TAKE 1 TABLET BY MOUTH ONCE A DAY 01/30/20  Yes Theadore Nan, NP  escitalopram (LEXAPRO) 10 MG tablet TAKE 1 TABLET BY MOUTH ONCE A DAY 01/30/20  Yes Theadore Nan, NP  omeprazole (PRILOSEC) 20 MG capsule Take 2 capsules by mouth daily as instructed 11/29/19  Yes Theadore Nan, NP  polyethylene glycol-electrolytes (NULYTELY) 420 g solution Prepare according to package instructions. Starting at 5:00 PM: Drink one 8 oz glass of mixture every 15 minutes until you finish half of the jug. Five hours prior to procedure, drink 8 oz glass of mixture every 15 minutes until it is all gone. Make sure you do not drink anything 4 hours prior to your procedure. 12/26/19  Yes Pasty Spillers, MD  famotidine (PEPCID) 20 MG tablet Take 1 tablet (20 mg total) by mouth daily. Patient not taking: Reported on 02/27/2020 12/26/19   Pasty Spillers, MD    Allergies as of 12/27/2019 - Review Complete 12/26/2019  Allergen Reaction Noted  . Flagyl [metronidazole] Nausea Only 05/21/2014    Family History  Problem Relation Age of Onset  . Cancer Other        breast  . Cancer  Mother 53       breast   . Breast cancer Mother 67  . Colon cancer Neg Hx   . Esophageal cancer Neg Hx   . Liver cancer Neg Hx   . Pancreatic cancer Neg Hx   . Rectal cancer Neg Hx   . Stomach cancer Neg Hx     Social History   Socioeconomic History  . Marital status: Married    Spouse name: Not on file  . Number of children: 2  . Years of education: Not on file  . Highest education level: Not on file  Occupational History  . 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 Smoking status: Former Smoker    Quit date: 2001    Years since quitting: 20.8  . Smokeless tobacco: Never Used  Vaping Use  . Vaping Use: Never used  Substance and Sexual Activity  . Alcohol use: Yes    Alcohol/week: 0.0 standard drinks    Comment: socially  . Drug use: Never  . Sexual activity: Not on file  Other Topics Concern  . Not on file  Social History Narrative  . Not on file   Social Determinants of Health   Financial Resource Strain:   . Difficulty of Paying Living Expenses: Not on file  Food Insecurity:   . Worried About Running  Out of Food in the Last Year: Not on file  . Ran Out of Food in the Last Year: Not on file  Transportation Needs:   . Lack of Transportation (Medical): Not on file  . Lack of Transportation (Non-Medical): Not on file  Physical Activity:   . Days of Exercise per Week: Not on file  . Minutes of Exercise per Session: Not on file  Stress:   . Feeling of Stress : Not on file  Social Connections:   . Frequency of Communication with Friends and Family: Not on file  . Frequency of Social Gatherings with Friends and Family: Not on file  . Attends Religious Services: Not on file  . Active Member of Clubs or Organizations: Not on file  . Attends Banker Meetings: Not on file  . Marital Status: Not on file  Intimate Partner Violence:   . Fear of Current or Ex-Partner: Not on file  . Emotionally Abused: Not on  file  . Physically Abused: Not on file  . Sexually Abused: Not on file    Review of Systems: See HPI, otherwise negative ROS  Physical Exam: BP 122/75   Pulse 69   Temp (!) 96.4 F (35.8 C) (Temporal)   Resp 18   Ht 5\' 4"  (1.626 m)   Wt 98.9 kg   SpO2 99%   BMI 37.42 kg/m  General:   Alert,  pleasant and cooperative in NAD Head:  Normocephalic and atraumatic. Neck:  Supple; no masses or thyromegaly. Lungs:  Clear throughout to auscultation, normal respiratory effort.    Heart:  +S1, +S2, Regular rate and rhythm, No edema. Abdomen:  Soft, nontender and nondistended. Normal bowel sounds, without guarding, and without rebound.   Neurologic:  Alert and  oriented x4;  grossly normal neurologically.  Impression/Plan: Krystal Mays is here for a colonoscopy to be performed for average risk screening.  Pt refused EGD this morning due to cost  Risks, benefits, limitations, and alternatives regarding  colonoscopy have been reviewed with the patient.  Questions have been answered.  All parties agreeable.   Krystal Marrow, MD  02/27/2020, 8:38 AM

## 2020-02-27 NOTE — Anesthesia Preprocedure Evaluation (Signed)
Anesthesia Evaluation  Patient identified by MRN, date of birth, ID band Patient awake    Reviewed: Allergy & Precautions, NPO status , Patient's Chart, lab work & pertinent test results  Airway Mallampati: II       Dental no notable dental hx.    Pulmonary neg pulmonary ROS, former smoker,    Pulmonary exam normal breath sounds clear to auscultation       Cardiovascular negative cardio ROS Normal cardiovascular exam Rhythm:Regular Rate:Normal     Neuro/Psych PSYCHIATRIC DISORDERS Anxiety Depression negative neurological ROS     GI/Hepatic Neg liver ROS, GERD  ,  Endo/Other  negative endocrine ROS  Renal/GU negative Renal ROS  negative genitourinary   Musculoskeletal negative musculoskeletal ROS (+)   Abdominal   Peds negative pediatric ROS (+)  Hematology negative hematology ROS (+)   Anesthesia Other Findings .Marland KitchenPast Medical History: No date: ADHD (attention deficit hyperactivity disorder) No date: Depression No date: Genital warts No date: GERD (gastroesophageal reflux disease) No date: Hernia, umbilical   Reproductive/Obstetrics negative OB ROS                             Anesthesia Physical Anesthesia Plan  ASA: II  Anesthesia Plan: General   Post-op Pain Management:    Induction: Intravenous  PONV Risk Score and Plan: 3 and Propofol infusion  Airway Management Planned: Nasal Cannula  Additional Equipment: None  Intra-op Plan:   Post-operative Plan:   Informed Consent: I have reviewed the patients History and Physical, chart, labs and discussed the procedure including the risks, benefits and alternatives for the proposed anesthesia with the patient or authorized representative who has indicated his/her understanding and acceptance.       Plan Discussed with: CRNA, Anesthesiologist and Surgeon  Anesthesia Plan Comments:         Anesthesia Quick  Evaluation

## 2020-02-27 NOTE — Anesthesia Postprocedure Evaluation (Deleted)
Anesthesia Post Note  Patient: Krystal Mays  Procedure(s) Performed: COLONOSCOPY WITH PROPOFOL (N/A )  Patient location during evaluation: Endoscopy Anesthesia Type: General Level of consciousness: awake and awake and alert Pain management: pain level controlled Vital Signs Assessment: post-procedure vital signs reviewed and stable Respiratory status: spontaneous breathing and nonlabored ventilation Cardiovascular status: blood pressure returned to baseline Postop Assessment: no apparent nausea or vomiting Anesthetic complications: no   No complications documented.   Last Vitals:  Vitals:   02/27/20 0818 02/27/20 0910  BP: 122/75 124/76  Pulse: 69   Resp: 18   Temp: (!) 35.8 C (!) 35.8 C  SpO2: 99%     Last Pain:  Vitals:   02/27/20 0920  TempSrc:   PainSc: 0-No pain                 Emilio Math

## 2020-02-27 NOTE — Anesthesia Postprocedure Evaluation (Signed)
Anesthesia Post Note  Patient: Krystal Mays  Procedure(s) Performed: COLONOSCOPY WITH PROPOFOL (N/A )  Patient location during evaluation: Endoscopy Anesthesia Type: General Level of consciousness: awake Pain management: pain level controlled Vital Signs Assessment: post-procedure vital signs reviewed and stable Respiratory status: spontaneous breathing Cardiovascular status: blood pressure returned to baseline Postop Assessment: no apparent nausea or vomiting Anesthetic complications: no   No complications documented.   Last Vitals:  Vitals:   02/27/20 0818  BP: 122/75  Pulse: 69  Resp: 18  Temp: (!) 35.8 C  SpO2: 99%    Last Pain:  Vitals:   02/27/20 0818  TempSrc: Temporal  PainSc: 0-No pain                 Emilio Math

## 2020-03-24 ENCOUNTER — Ambulatory Visit: Payer: BC Managed Care – PPO | Admitting: Gastroenterology

## 2020-04-14 ENCOUNTER — Ambulatory Visit: Payer: BC Managed Care – PPO | Admitting: Nurse Practitioner

## 2020-04-21 ENCOUNTER — Other Ambulatory Visit: Payer: Self-pay | Admitting: Gastroenterology

## 2020-05-25 ENCOUNTER — Other Ambulatory Visit: Payer: Self-pay | Admitting: Nurse Practitioner

## 2020-05-25 DIAGNOSIS — F418 Other specified anxiety disorders: Secondary | ICD-10-CM

## 2020-05-25 MED ORDER — ESCITALOPRAM OXALATE 10 MG PO TABS: 10.0000 mg | ORAL_TABLET | Freq: Every day | ORAL | 1 refills | Status: DC

## 2020-05-25 MED ORDER — BUPROPION HCL ER (XL) 300 MG PO TB24: 300.0000 mg | ORAL_TABLET | Freq: Every day | ORAL | 1 refills | Status: DC

## 2020-05-25 NOTE — Telephone Encounter (Signed)
Call pt Courtesy refills provided however I can refill again Please ask her to est care with a new primary care provided We had recommend [facility]

## 2020-05-25 NOTE — Telephone Encounter (Signed)
Patient need refill for buPROPion (WELLBUTRIN XL) 300 MG 24 hr tablet and escitalopram (LEXAPRO) 10 MG tablet

## 2020-07-22 ENCOUNTER — Other Ambulatory Visit: Payer: Self-pay | Admitting: Family

## 2020-07-22 DIAGNOSIS — F418 Other specified anxiety disorders: Secondary | ICD-10-CM

## 2020-07-22 NOTE — Telephone Encounter (Signed)
Patient is establishing with new NP on 08/13/20 ok to refill?

## 2020-08-13 ENCOUNTER — Other Ambulatory Visit: Payer: Self-pay

## 2020-08-13 ENCOUNTER — Ambulatory Visit (INDEPENDENT_AMBULATORY_CARE_PROVIDER_SITE_OTHER): Payer: BC Managed Care – PPO | Admitting: Adult Health

## 2020-08-13 ENCOUNTER — Encounter: Payer: Self-pay | Admitting: Adult Health

## 2020-08-13 VITALS — BP 124/70 | HR 66 | Temp 98.3°F | Ht 64.02 in | Wt 221.4 lb

## 2020-08-13 DIAGNOSIS — Z6837 Body mass index (BMI) 37.0-37.9, adult: Secondary | ICD-10-CM | POA: Diagnosis not present

## 2020-08-13 DIAGNOSIS — E538 Deficiency of other specified B group vitamins: Secondary | ICD-10-CM

## 2020-08-13 DIAGNOSIS — F418 Other specified anxiety disorders: Secondary | ICD-10-CM | POA: Diagnosis not present

## 2020-08-13 DIAGNOSIS — E559 Vitamin D deficiency, unspecified: Secondary | ICD-10-CM | POA: Diagnosis not present

## 2020-08-13 DIAGNOSIS — K219 Gastro-esophageal reflux disease without esophagitis: Secondary | ICD-10-CM

## 2020-08-13 MED ORDER — ESCITALOPRAM OXALATE 10 MG PO TABS: 1.0000 | ORAL_TABLET | Freq: Every day | ORAL | 1 refills | Status: DC

## 2020-08-13 MED ORDER — BUPROPION HCL ER (XL) 300 MG PO TB24: 1.0000 | ORAL_TABLET | Freq: Every day | ORAL | 1 refills | Status: DC

## 2020-08-13 MED ORDER — OMEPRAZOLE 20 MG PO CPDR: 20.0000 mg | DELAYED_RELEASE_CAPSULE | Freq: Every day | ORAL | 1 refills | Status: DC

## 2020-08-13 NOTE — Progress Notes (Signed)
New Patient Office Visit  Subjective:  Patient ID: Krystal Mays, female    DOB: Sep 13, 1965  Age: 55 y.o. MRN: [id number]  CC:  Chief Complaint  Patient presents with  . Transitions Of Care    HPI Krystal Mays presents for refill on chtonic medications. She feels well, denies any concerns and reports she has been on her antidepressant medication for 20 plus years without changes.   She denied discussing any other concerns at today's visit.  Patient  denies any fever, body aches,chills, rash, chest pain, shortness of breath, nausea, vomiting, or diarrhea.  Denies dizziness, lightheadedness, pre syncopal or syncopal episodes.  Past Medical History:  Diagnosis Date  . ADHD (attention deficit hyperactivity disorder)   . Depression   . Genital warts   . GERD (gastroesophageal reflux disease)   . Hernia, umbilical     Past Surgical History:  Procedure Laterality Date  . CESAREAN SECTION     two  . CHOLECYSTECTOMY    . COLONOSCOPY WITH PROPOFOL N/A 02/27/2020   Procedure: COLONOSCOPY WITH PROPOFOL;  Surgeon: Pasty Spillers, MD;  Location: [facility] ENDOSCOPY;  Service: Endoscopy;  Laterality: N/A;  . ENDOMETRIAL ABLATION  2008  . UMBILICAL HERNIA REPAIR      Family History  Problem Relation Age of Onset  . Cancer Other        breast  . Cancer Mother 53       breast   . Breast cancer Mother 66  . Colon cancer Neg Hx   . Esophageal cancer Neg Hx   . Liver cancer Neg Hx   . Pancreatic cancer Neg Hx   . Rectal cancer Neg Hx   . Stomach cancer Neg Hx     Social History   Socioeconomic History  . Marital status: Married    Spouse name: Not on file  . Number of children: 2  . Years of education: Not on file  . Highest education level: Not on file  Occupational History  . 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 Smoking status: Former Smoker    Quit date: 2001    Years since quitting: 21.3  . Smokeless tobacco:  Never Used  Vaping Use  . Vaping Use: Never used  Substance and Sexual Activity  . Alcohol use: Yes    Alcohol/week: 0.0 standard drinks    Comment: socially  . Drug use: Never  . Sexual activity: Not on file  Other Topics Concern  . Not on file  Social History Narrative  . Not on file   Social Determinants of Health   Financial Resource Strain: Not on file  Food Insecurity: Not on file  Transportation Needs: Not on file  Physical Activity: Not on file  Stress: Not on file  Social Connections: Not on file  Intimate Partner Violence: Not on file    ROS Review of Systems  Constitutional: Negative.   HENT: Negative.   Respiratory: Negative.   Cardiovascular: Negative.   Gastrointestinal: Negative.   Genitourinary: Negative.   Musculoskeletal: Negative.   Psychiatric/Behavioral: The patient is nervous/anxious.     Objective:   Today's Vitals: BP 124/70 (BP Location: Left Arm, Patient Position: Sitting)   Pulse 66   Temp 98.3 F (36.8 C)   Ht 5' 4.02" (1.626 m)   Wt 221 lb 6.4 oz (100.4 kg)   SpO2 94%   BMI 37.98 kg/m   Physical Exam Vitals and nursing note  reviewed.  Constitutional:      Appearance: Normal appearance. She is not ill-appearing.  HENT:     Head: Normocephalic and atraumatic.     Right Ear: External ear normal.     Left Ear: External ear normal.     Nose: Nose normal.     Mouth/Throat:     Mouth: Mucous membranes are moist.  Eyes:     General: No scleral icterus.       Right eye: No discharge.        Left eye: No discharge.     Conjunctiva/sclera: Conjunctivae normal.     Pupils: Pupils are equal, round, and reactive to light.  Cardiovascular:     Rate and Rhythm: Normal rate and regular rhythm.     Pulses: Normal pulses.     Heart sounds: Normal heart sounds. No murmur heard. No friction rub. No gallop.   Pulmonary:     Effort: Pulmonary effort is normal. No respiratory distress.     Breath sounds: Normal breath sounds. No stridor.  No wheezing, rhonchi or rales.  Chest:     Chest wall: No tenderness.  Abdominal:     General: Bowel sounds are normal. There is no distension.     Palpations: Abdomen is soft.     Tenderness: There is no abdominal tenderness. There is no guarding.  Genitourinary:    Comments: Deferred.  Musculoskeletal:        General: No tenderness. Normal range of motion.     Cervical back: Normal range of motion and neck supple.     Right lower leg: No edema.     Left lower leg: No edema.  Skin:    General: Skin is warm.     Findings: No erythema, lesion or rash.  Neurological:     Mental Status: She is alert and oriented to person, place, and time.     Motor: No weakness.     Gait: Gait normal.  Psychiatric:        Mood and Affect: Mood normal.        Behavior: Behavior normal.        Thought Content: Thought content normal.        Judgment: Judgment normal.     Assessment & Plan:   Problem List Items Addressed This Visit      Digestive   GERD (gastroesophageal reflux disease)   Relevant Medications   omeprazole (PRILOSEC) 20 MG capsule   Other Relevant Orders   Comprehensive metabolic panel   TSH   CBC with Differential/Platelet     Other   Depression with anxiety   Relevant Medications   buPROPion (WELLBUTRIN XL) 300 MG 24 hr tablet   escitalopram (LEXAPRO) 10 MG tablet   Other Relevant Orders   Lipid Panel w/o Chol/HDL Ratio   Comprehensive metabolic panel   TSH   CBC with Differential/Platelet    Other Visit Diagnoses    BMI 37.0-37.9, adult    -  Primary   Vitamin D deficiency       Relevant Orders   VITAMIN D 25 Hydroxy (Vit-D Deficiency, Fractures)   B12 deficiency       Relevant Orders   B12 and Folate Panel     Orders Placed This Encounter  Procedures  . VITAMIN D 25 Hydroxy (Vit-D Deficiency, Fractures)  . Lipid Panel w/o Chol/HDL Ratio  . Comprehensive metabolic panel  . TSH  . CBC with Differential/Platelet  . B12 and Folate Panel  Outpatient  Encounter Medications as of 08/13/2020  Medication Sig  . [DISCONTINUED] buPROPion (WELLBUTRIN XL) 300 MG 24 hr tablet TAKE 1 TABLET BY MOUTH ONCE A DAY  . [DISCONTINUED] escitalopram (LEXAPRO) 10 MG tablet TAKE 1 TABLET BY MOUTH ONCE A DAY  . [DISCONTINUED] omeprazole (PRILOSEC) 20 MG capsule Take 2 capsules by mouth daily as instructed  . buPROPion (WELLBUTRIN XL) 300 MG 24 hr tablet Take 1 tablet (300 mg total) by mouth daily.  Marland Kitchen escitalopram (LEXAPRO) 10 MG tablet Take 1 tablet (10 mg total) by mouth daily.  Marland Kitchen omeprazole (PRILOSEC) 20 MG capsule Take 1 capsule (20 mg total) by mouth daily. Take 2 capsules by mouth daily as instructed  . polyethylene glycol-electrolytes (NULYTELY) 420 g solution Prepare according to package instructions. Starting at 5:00 PM: Drink one 8 oz glass of mixture every 15 minutes until you finish half of the jug. Five hours prior to procedure, drink 8 oz glass of mixture every 15 minutes until it is all gone. Make sure you do not drink anything 4 hours prior to your procedure. (Patient not taking: Reported on 08/13/2020)  . [DISCONTINUED] famotidine (PEPCID) 20 MG tablet Take 1 tablet (20 mg total) by mouth daily. (Patient not taking: Reported on 08/13/2020)   No facility-administered encounter medications on file as of 08/13/2020.   Red Flags discussed. The patient was given clear instructions to go to ER or return to medical center if any red flags develop, symptoms do not improve, worsen or new problems develop. They verbalized understanding.  Discussed known black box warning for anti depression/ anxiety medication. Need to report any behavioral changes right, if any homicidal or suicidal thoughts or ideas seek medical attention right away. Call 911.   Follow-up: Return in about 6 months (around 02/12/2021), or if symptoms worsen or fail to improve, for Go to Emergency room/ urgent care if worse.   Jairo Ben, FNP

## 2020-08-13 NOTE — Patient Instructions (Addendum)
Escitalopram Tablets What is this medicine? ESCITALOPRAM (es sye TAL oh pram) is used to treat depression and certain types of anxiety. This medicine may be used for other purposes; ask your health care provider or pharmacist if you have questions. COMMON BRAND NAME(S): Lexapro What should I tell my health care provider before I take this medicine? They need to know if you have any of these conditions:  bipolar disorder or a family history of bipolar disorder  diabetes  glaucoma  heart disease  kidney or liver disease  receiving electroconvulsive therapy  seizures (convulsions)  suicidal thoughts, plans, or attempt by you or a family member  an unusual or allergic reaction to escitalopram, the related drug citalopram, other medicines, foods, dyes, or preservatives  pregnant or trying to become pregnant  breast-feeding How should I use this medicine? Take this medicine by mouth with a glass of water. Follow the directions on the prescription label. You can take it with or without food. If it upsets your stomach, take it with food. Take your medicine at regular intervals. Do not take it more often than directed. Do not stop taking this medicine suddenly except upon the advice of your doctor. Stopping this medicine too quickly may cause serious side effects or your condition may worsen. A special MedGuide will be given to you by the pharmacist with each prescription and refill. Be sure to read this information carefully each time. Talk to your pediatrician regarding the use of this medicine in children. Special care may be needed. Overdosage: If you think you have taken too much of this medicine contact a poison control center or emergency room at once. NOTE: This medicine is only for you. Do not share this medicine with others. What if I miss a dose? If you miss a dose, take it as soon as you can. If it is almost time for your next dose, take only that dose. Do not take double  or extra doses. What may interact with this medicine? Do not take this medicine with any of the following medications:  certain medicines for fungal infections like fluconazole, itraconazole, ketoconazole, posaconazole, voriconazole  cisapride  citalopram  dronedarone  linezolid  MAOIs like Carbex, Eldepryl, Marplan, Nardil, and Parnate  methylene blue (injected into a vein)  pimozide  thioridazine This medicine may also interact with the following medications:  alcohol  amphetamines  aspirin and aspirin-like medicines  carbamazepine  certain medicines for depression, anxiety, or psychotic disturbances  certain medicines for migraine headache like almotriptan, eletriptan, frovatriptan, naratriptan, rizatriptan, sumatriptan, zolmitriptan  certain medicines for sleep  certain medicines that treat or prevent blood clots like warfarin, enoxaparin, dalteparin  cimetidine  diuretics  dofetilide  fentanyl  furazolidone  isoniazid  lithium  metoprolol  NSAIDs, medicines for pain and inflammation, like ibuprofen or naproxen  other medicines that prolong the QT interval (cause an abnormal heart rhythm)  procarbazine  rasagiline  supplements like St. John's wort, kava kava, valerian  tramadol  tryptophan  ziprasidone This list may not describe all possible interactions. Give your health care provider a list of all the medicines, herbs, non-prescription drugs, or dietary supplements you use. Also tell them if you smoke, drink alcohol, or use illegal drugs. Some items may interact with your medicine. What should I watch for while using this medicine? Tell your doctor if your symptoms do not get better or if they get worse. Visit your doctor or health care professional for regular checks on your progress. Because it  may take several weeks to see the full effects of this medicine, it is important to continue your treatment as prescribed by your  doctor. Patients and their families should watch out for new or worsening thoughts of suicide or depression. Also watch out for sudden changes in feelings such as feeling anxious, agitated, panicky, irritable, hostile, aggressive, impulsive, severely restless, overly excited and hyperactive, or not being able to sleep. If this happens, especially at the beginning of treatment or after a change in dose, call your health care professional. Bonita Quin may get drowsy or dizzy. Do not drive, use machinery, or do anything that needs mental alertness until you know how this medicine affects you. Do not stand or sit up quickly, especially if you are an older patient. This reduces the risk of dizzy or fainting spells. Alcohol may interfere with the effect of this medicine. Avoid alcoholic drinks. Your mouth may get dry. Chewing sugarless gum or sucking hard candy, and drinking plenty of water may help. Contact your doctor if the problem does not go away or is severe. What side effects may I notice from receiving this medicine? Side effects that you should report to your doctor or health care professional as soon as possible:  allergic reactions like skin rash, itching or hives, swelling of the face, lips, or tongue  anxious  black, tarry stools  changes in vision  confusion  elevated mood, decreased need for sleep, racing thoughts, impulsive behavior  eye pain  fast, irregular heartbeat  feeling faint or lightheaded, falls  feeling agitated, angry, or irritable  hallucination, loss of contact with reality  loss of balance or coordination  loss of memory  painful or prolonged erections  restlessness, pacing, inability to keep still  seizures  stiff muscles  suicidal thoughts or other mood changes  trouble sleeping  unusual bleeding or bruising  unusually weak or tired  vomiting Side effects that usually do not require medical attention (report to your doctor or health care  professional if they continue or are bothersome):  changes in appetite  change in sex drive or performance  headache  increased sweating  indigestion, nausea  tremors This list may not describe all possible side effects. Call your doctor for medical advice about side effects. You may report side effects to FDA at 1-800-FDA-1088. Where should I keep my medicine? Keep out of reach of children. Store at room temperature between 15 and 30 degrees C (59 and 86 degrees F). Throw away any unused medicine after the expiration date. NOTE: This sheet is a summary. It may not cover all possible information. If you have questions about this medicine, talk to your doctor, pharmacist, or health care provider.  2021 Elsevier/Gold Standard (2020-02-24 09:53:34) Bupropion tablets (Depression/Mood Disorders) What is this medicine? BUPROPION (byoo PROE pee on) is used to treat depression. This medicine may be used for other purposes; ask your health care provider or pharmacist if you have questions. COMMON BRAND NAME(S): Wellbutrin What should I tell my health care provider before I take this medicine? They need to know if you have any of these conditions:  an eating disorder, such as anorexia or bulimia  bipolar disorder or psychosis  diabetes or high blood sugar, treated with medication  glaucoma  heart disease, previous heart attack, or irregular heart beat  head injury or brain tumor  high blood pressure  kidney or liver disease  seizures  suicidal thoughts or a previous suicide attempt  Tourette's syndrome  weight loss  an unusual or allergic reaction to bupropion, other medicines, foods, dyes, or preservatives  breast-feeding  pregnant or trying to become pregnant How should I use this medicine? Take this medicine by mouth with a glass of water. Follow the directions on the prescription label. You can take it with or without food. If it upsets your stomach, take it with  food. Take your medicine at regular intervals. Do not take your medicine more often than directed. Do not stop taking this medicine suddenly except upon the advice of your doctor. Stopping this medicine too quickly may cause serious side effects or your condition may worsen. A special MedGuide will be given to you by the pharmacist with each prescription and refill. Be sure to read this information carefully each time. Talk to your pediatrician regarding the use of this medicine in children. Special care may be needed. Overdosage: If you think you have taken too much of this medicine contact a poison control center or emergency room at once. NOTE: This medicine is only for you. Do not share this medicine with others. What if I miss a dose? If you miss a dose, take it as soon as you can. If it is less than four hours to your next dose, take only that dose and skip the missed dose. Do not take double or extra doses. What may interact with this medicine? Do not take this medicine with any of the following medications:  linezolid  MAOIs like Azilect, Carbex, Eldepryl, Marplan, Nardil, and Parnate  methylene blue (injected into a vein)  other medicines that contain bupropion like Zyban This medicine may also interact with the following medications:  alcohol  certain medicines for anxiety or sleep  certain medicines for blood pressure like metoprolol, propranolol  certain medicines for depression or psychotic disturbances  certain medicines for HIV or AIDS like efavirenz, lopinavir, nelfinavir, ritonavir  certain medicines for irregular heart beat like propafenone, flecainide  certain medicines for Parkinson's disease like amantadine, levodopa  certain medicines for seizures like carbamazepine, phenytoin, phenobarbital  cimetidine  clopidogrel  cyclophosphamide  digoxin  furazolidone  isoniazid  nicotine  orphenadrine  procarbazine  steroid medicines like prednisone or  cortisone  stimulant medicines for attention disorders, weight loss, or to stay awake  tamoxifen  theophylline  thiotepa  ticlopidine  tramadol  warfarin This list may not describe all possible interactions. Give your health care provider a list of all the medicines, herbs, non-prescription drugs, or dietary supplements you use. Also tell them if you smoke, drink alcohol, or use illegal drugs. Some items may interact with your medicine. What should I watch for while using this medicine? Tell your doctor if your symptoms do not get better or if they get worse. Visit your doctor or healthcare provider for regular checks on your progress. Because it may take several weeks to see the full effects of this medicine, it is important to continue your treatment as prescribed by your doctor. This medicine may cause serious skin reactions. They can happen weeks to months after starting the medicine. Contact your healthcare provider right away if you notice fevers or flu-like symptoms with a rash. The rash may be red or purple and then turn into blisters or peeling of the skin. Or, you might notice a red rash with swelling of the face, lips or lymph nodes in your neck or under your arms. Patients and their families should watch out for new or worsening thoughts of suicide or depression. Also watch out for  sudden changes in feelings such as feeling anxious, agitated, panicky, irritable, hostile, aggressive, impulsive, severely restless, overly excited and hyperactive, or not being able to sleep. If this happens, especially at the beginning of treatment or after a change in dose, call your healthcare provider. Avoid alcoholic drinks while taking this medicine. Drinking excessive alcoholic beverages, using sleeping or anxiety medicines, or quickly stopping the use of these agents while taking this medicine may increase your risk for a seizure. Do not drive or use heavy machinery until you know how this  medicine affects you. This medicine can impair your ability to perform these tasks. Do not take this medicine close to bedtime. It may prevent you from sleeping. Your mouth may get dry. Chewing sugarless gum or sucking hard candy, and drinking plenty of water may help. Contact your doctor if the problem does not go away or is severe. What side effects may I notice from receiving this medicine? Side effects that you should report to your doctor or health care professional as soon as possible:  allergic reactions like skin rash, itching or hives, swelling of the face, lips, or tongue  breathing problems  changes in vision  confusion  elevated mood, decreased need for sleep, racing thoughts, impulsive behavior  fast or irregular heartbeat  hallucinations, loss of contact with reality  increased blood pressure  rash, fever, and swollen lymph nodes  redness, blistering, peeling, or loosening of the skin, including inside the mouth  seizures  suicidal thoughts or other mood changes  unusually weak or tired  vomiting Side effects that usually do not require medical attention (report to your doctor or health care professional if they continue or are bothersome):  constipation  headache  loss of appetite  nausea  tremors  weight loss This list may not describe all possible side effects. Call your doctor for medical advice about side effects. You may report side effects to FDA at 1-800-FDA-1088. Where should I keep my medicine? Keep out of the reach of children. Store at room temperature between 20 and 25 degrees C (68 and 77 degrees F), away from direct sunlight and moisture. Keep tightly closed. Throw away any unused medicine after the expiration date. NOTE: This sheet is a summary. It may not cover all possible information. If you have questions about this medicine, talk to your doctor, pharmacist, or health care provider.  2021 Elsevier/Gold Standard (2018-06-28  14:02:47) http://NIMH.NIH.Gov">  Generalized Anxiety Disorder, Adult Generalized anxiety disorder (GAD) is a mental health condition. Unlike normal worries, anxiety related to GAD is not triggered by a specific event. These worries do not fade or get better with time. GAD interferes with relationships, work, and school. GAD symptoms can vary from mild to severe. People with severe GAD can have intense waves of anxiety with physical symptoms that are similar to panic attacks. What are the causes? The exact cause of GAD is not known, but the following are believed to have an impact:  Differences in natural brain chemicals.  Genes passed down from parents to children.  Differences in the way threats are perceived.  Development during childhood.  Personality. What increases the risk? The following factors may make you more likely to develop this condition:  Being female.  Having a family history of anxiety disorders.  Being very shy.  Experiencing very stressful life events, such as the death of a loved one.  Having a very stressful family environment. What are the signs or symptoms? People with GAD often worry excessively  about many things in their lives, such as their health and family. Symptoms may also include:  Mental and emotional symptoms: ? Worrying excessively about natural disasters. ? Fear of being late. ? Difficulty concentrating. ? Fears that others are judging your performance.  Physical symptoms: ? Fatigue. ? Headaches, muscle tension, muscle twitches, trembling, or feeling shaky. ? Feeling like your heart is pounding or beating very fast. ? Feeling out of breath or like you cannot take a deep breath. ? Having trouble falling asleep or staying asleep, or experiencing restlessness. ? Sweating. ? Nausea, diarrhea, or irritable bowel syndrome (IBS).  Behavioral symptoms: ? Experiencing erratic moods or irritability. ? Avoidance of new situations. ? Avoidance  of people. ? Extreme difficulty making decisions. How is this diagnosed? This condition is diagnosed based on your symptoms and medical history. You will also have a physical exam. Your health care provider may perform tests to rule out other possible causes of your symptoms. To be diagnosed with GAD, a person must have anxiety that:  Is out of his or her control.  Affects several different aspects of his or her life, such as work and relationships.  Causes distress that makes him or her unable to take part in normal activities.  Includes at least three symptoms of GAD, such as restlessness, fatigue, trouble concentrating, irritability, muscle tension, or sleep problems. Before your health care provider can confirm a diagnosis of GAD, these symptoms must be present more days than they are not, and they must last for 6 months or longer. How is this treated? This condition may be treated with:  Medicine. Antidepressant medicine is usually prescribed for long-term daily control. Anti-anxiety medicines may be added in severe cases, especially when panic attacks occur.  Talk therapy (psychotherapy). Certain types of talk therapy can be helpful in treating GAD by providing support, education, and guidance. Options include: ? Cognitive behavioral therapy (CBT). People learn coping skills and self-calming techniques to ease their physical symptoms. They learn to identify unrealistic thoughts and behaviors and to replace them with more appropriate thoughts and behaviors. ? Acceptance and commitment therapy (ACT). This treatment teaches people how to be mindful as a way to cope with unwanted thoughts and feelings. ? Biofeedback. This process trains you to manage your body's response (physiological response) through breathing techniques and relaxation methods. You will work with a therapist while machines are used to monitor your physical symptoms.  Stress management techniques. These include yoga,  meditation, and exercise. A mental health specialist can help determine which treatment is best for you. Some people see improvement with one type of therapy. However, other people require a combination of therapies.   Follow these instructions at home: Lifestyle  Maintain a consistent routine and schedule.  Anticipate stressful situations. Create a plan, and allow extra time to work with your plan.  Practice stress management or self-calming techniques that you have learned from your therapist or your health care provider. General instructions  Take over-the-counter and prescription medicines only as told by your health care provider.  Understand that you are likely to have setbacks. Accept this and be kind to yourself as you persist to take better care of yourself.  Recognize and accept your accomplishments, even if you judge them as small.  Keep all follow-up visits as told by your health care provider. This is important. Contact a health care provider if:  Your symptoms do not get better.  Your symptoms get worse.  You have signs of depression, such  as: ? A persistently sad or irritable mood. ? Loss of enjoyment in activities that used to bring you joy. ? Change in weight or eating. ? Changes in sleeping habits. ? Avoiding friends or family members. ? Loss of energy for normal tasks. ? Feelings of guilt or worthlessness. Get help right away if:  You have serious thoughts about hurting yourself or others. If you ever feel like you may hurt yourself or others, or have thoughts about taking your own life, get help right away. Go to your nearest emergency department or:  Call your local emergency services (911 in the [place]  Call a suicide crisis helpline, such as the National Suicide Prevention Lifeline at (404)686-63991-435 091 0346. This is open 24 hours a day in the [place]  Text the Crisis Text Line at 915-251-8083741741 (in the [place] Summary  Generalized anxiety disorder (GAD) is a mental  health condition that involves worry that is not triggered by a specific event.  People with GAD often worry excessively about many things in their lives, such as their health and family.  GAD may cause symptoms such as restlessness, trouble concentrating, sleep problems, frequent sweating, nausea, diarrhea, headaches, and trembling or muscle twitching.  A mental health specialist can help determine which treatment is best for you. Some people see improvement with one type of therapy. However, other people require a combination of therapies. This information is not intended to replace advice given to you by your health care provider. Make sure you discuss any questions you have with your health care provider. Document Revised: 01/23/2019 Document Reviewed: 01/23/2019 Elsevier Patient Education  2021 [employer]

## 2020-08-24 ENCOUNTER — Encounter: Payer: Self-pay | Admitting: Adult Health

## 2020-08-24 ENCOUNTER — Ambulatory Visit (INDEPENDENT_AMBULATORY_CARE_PROVIDER_SITE_OTHER): Payer: BC Managed Care – PPO | Admitting: Adult Health

## 2020-08-24 ENCOUNTER — Other Ambulatory Visit: Payer: Self-pay

## 2020-08-24 VITALS — BP 92/64 | HR 80 | Temp 98.0°F | Ht 64.02 in | Wt 218.8 lb

## 2020-08-24 DIAGNOSIS — R399 Unspecified symptoms and signs involving the genitourinary system: Secondary | ICD-10-CM

## 2020-08-24 DIAGNOSIS — S20461A Insect bite (nonvenomous) of right back wall of thorax, initial encounter: Secondary | ICD-10-CM | POA: Diagnosis not present

## 2020-08-24 DIAGNOSIS — W57XXXA Bitten or stung by nonvenomous insect and other nonvenomous arthropods, initial encounter: Secondary | ICD-10-CM

## 2020-08-24 DIAGNOSIS — M545 Low back pain, unspecified: Secondary | ICD-10-CM | POA: Diagnosis not present

## 2020-08-24 DIAGNOSIS — R5383 Other fatigue: Secondary | ICD-10-CM | POA: Diagnosis not present

## 2020-08-24 DIAGNOSIS — L089 Local infection of the skin and subcutaneous tissue, unspecified: Secondary | ICD-10-CM | POA: Diagnosis not present

## 2020-08-24 LAB — POCT URINALYSIS DIPSTICK
Glucose, UA: NEGATIVE
Leukocytes, UA: NEGATIVE
Nitrite, UA: NEGATIVE
Protein, UA: NEGATIVE
Spec Grav, UA: >=1.03 — AB (ref 1.010–1.025)
Urobilinogen, UA: 0.2 E.U./dL
pH, UA: 5 (ref 5.0–8.0)

## 2020-08-24 MED ORDER — AMOXICILLIN-POT CLAVULANATE 875-125 MG PO TABS: 1.0000 | ORAL_TABLET | Freq: Two times a day (BID) | ORAL | 0 refills | Status: DC

## 2020-08-24 NOTE — Patient Instructions (Addendum)
Urinary Tract Infection, Adult A urinary tract infection (UTI) is an infection of any part of the urinary tract. The urinary tract includes:  The kidneys.  The ureters.  The bladder.  The urethra. These organs make, store, and get rid of pee (urine) in the body. What are the causes? This infection is caused by germs (bacteria) in your genital area. These germs grow and cause swelling (inflammation) of your urinary tract. What increases the risk? The following factors may make you more likely to develop this condition:  Using a small, thin tube (catheter) to drain pee.  Not being able to control when you pee or poop (incontinence).  Being female. If you are female, these things can increase the risk: ? Using these methods to prevent pregnancy:  A medicine that kills sperm (spermicide).  A device that blocks sperm (diaphragm). ? Having low levels of a female hormone (estrogen). ? Being pregnant. You are more likely to develop this condition if:  You have genes that add to your risk.  You are sexually active.  You take antibiotic medicines.  You have trouble peeing because of: ? A prostate that is bigger than normal, if you are female. ? A blockage in the part of your body that drains pee from the bladder. ? A kidney stone. ? A nerve condition that affects your bladder. ? Not getting enough to drink. ? Not peeing often enough.  You have other conditions, such as: ? Diabetes. ? A weak disease-fighting system (immune system). ? Sickle cell disease. ? Gout. ? Injury of the spine. What are the signs or symptoms? Symptoms of this condition include:  Needing to pee right away.  Peeing small amounts often.  Pain or burning when peeing.  Blood in the pee.  Pee that smells bad or not like normal.  Trouble peeing.  Pee that is cloudy.  Fluid coming from the vagina, if you are female.  Pain in the belly or lower back. Other symptoms include:  Vomiting.  Not  feeling hungry.  Feeling mixed up (confused). This may be the first symptom in older adults.  Being tired and grouchy (irritable).  A fever.  Watery poop (diarrhea). How is this treated?  Taking antibiotic medicine.  Taking other medicines.  Drinking enough water. In some cases, you may need to see a specialist. Follow these instructions at home: Medicines  Take over-the-counter and prescription medicines only as told by your doctor.  If you were prescribed an antibiotic medicine, take it as told by your doctor. Do not stop taking it even if you start to feel better. General instructions  Make sure you: ? Pee until your bladder is empty. ? Do not hold pee for a long time. ? Empty your bladder after sex. ? Wipe from front to back after peeing or pooping if you are a female. Use each tissue one time when you wipe.  Drink enough fluid to keep your pee pale yellow.  Keep all follow-up visits.   Contact a doctor if:  You do not get better after 1-2 days.  Your symptoms go away and then come back. Get help right away if:  You have very bad back pain.  You have very bad pain in your lower belly.  You have a fever.  You have chills.  You feeling like you will vomit or you vomit. Summary  A urinary tract infection (UTI) is an infection of any part of the urinary tract.  This condition is caused by   germs in your genital area.  There are many risk factors for a UTI.  Treatment includes antibiotic medicines.  Drink enough fluid to keep your pee pale yellow. This information is not intended to replace advice given to you by your health care provider. Make sure you discuss any questions you have with your health care provider. Document Revised: 11/15/2019 Document Reviewed: 11/15/2019 Elsevier Patient Education  2021 [place]. Amoxicillin; Clavulanic Acid Tablets What is this medicine? AMOXICILLIN; CLAVULANIC ACID (a mox i SIL in; KLAV yoo lan ic AS id) is a  penicillin antibiotic. It treats some infections caused by bacteria. It will not work for colds, the flu, or other viruses. This medicine may be used for other purposes; ask your health care provider or pharmacist if you have questions. COMMON BRAND NAME(S): Augmentin What should I tell my health care provider before I take this medicine? They need to know if you have any of these conditions:  kidney disease  liver disease  mononucleosis  stomach or intestine problems such as colitis  an unusual or allergic reaction to amoxicillin, other penicillin or cephalosporin antibiotics, clavulanic acid, other medicines, foods, dyes, or preservatives  pregnant or trying to get pregnant  breast-feeding How should I use this medicine? Take this drug by mouth. Take it as directed on the prescription label at the same time every day. Take it with food at the start of a meal or snack. Take all of this drug unless your health care provider tells you to stop it early. Keep taking it even if you think you are better. Talk to your health care provider about the use of this drug in children. While it may be prescribed for selected conditions, precautions do apply. Overdosage: If you think you have taken too much of this medicine contact a poison control center or emergency room at once. NOTE: This medicine is only for you. Do not share this medicine with others. What if I miss a dose? If you miss a dose, take it as soon as you can. If it is almost time for your next dose, take only that dose. Do not take double or extra doses. What may interact with this medicine?  allopurinol  anticoagulants  birth control pills  methotrexate  probenecid This list may not describe all possible interactions. Give your health care provider a list of all the medicines, herbs, non-prescription drugs, or dietary supplements you use. Also tell them if you smoke, drink alcohol, or use illegal drugs. Some items may  interact with your medicine. What should I watch for while using this medicine? Tell your health care provider if your symptoms do not start to get better or if they get worse. This medicine may cause serious skin reactions. They can happen weeks to months after starting the medicine. Contact your health care provider right away if you notice fevers or flu-like symptoms with a rash. The rash may be red or purple and then turn into blisters or peeling of the skin. Or, you might notice a red rash with swelling of the face, lips or lymph nodes in your neck or under your arms. Do not treat diarrhea with over the counter products. Contact your health care provider if you have diarrhea that lasts more than 2 days or if it is severe and watery. If you have diabetes, you may get a false-positive result for sugar in your urine. Check with your health care provider. Birth control may not work properly while you are taking  this medicine. Talk to your health care provider about using an extra method of birth control. What side effects may I notice from receiving this medicine? Side effects that you should report to your doctor or health care provider as soon as possible:  allergic reactions (skin rash, itching or hives; swelling of the face, lips, or tongue)  bloody or watery diarrhea  dark urine  infection (fever, chills, cough, sore throat, or pain)  kidney injury (trouble passing urine or change in the amount of urine)  redness, blistering, peeling, or loosening of the skin, including inside the mouth  seizures  thrush (white patches in the mouth or mouth sores)  trouble breathing  unusual bruising or bleeding  unusually weak or tired Side effects that usually do not require medical attention (report to your doctor or health care provider if they continue or are bothersome):  diarrhea  dizziness  headache  nausea, vomiting  unusual vaginal discharge, itching, or odor  upset  stomach This list may not describe all possible side effects. Call your doctor for medical advice about side effects. You may report side effects to FDA at 1-800-FDA-1088. Where should I keep my medicine? Keep out of the reach of children and pets. Store at room temperature between 20 and 25 degrees C (68 and 77 degrees F). Throw away any unused drug after the expiration date. NOTE: This sheet is a summary. It may not cover all possible information. If you have questions about this medicine, talk to your doctor, pharmacist, or health care provider.  2021 Elsevier/Gold Standard (2020-02-26 09:45:03)   Tick Bite Information, Adult  Ticks are insects that can bite. Most ticks live in shrubs and grassy areas. They climb onto people and animals that go by. Then they bite. Some ticks carry germs that can make you sick. How can I prevent tick bites? Take these steps: Use insect repellent  Use an insect repellent that has 20% or higher of the ingredients DEET, picaridin, or IR3535. Follow the instructions on the label. Put it on: ? Bare skin. ? The tops of your boots. ? Your pant legs. ? The ends of your sleeves.  If you use an insect repellent that has the ingredient permethrin, follow the instructions on the label. Put it on: ? Clothing. ? Boots. ? Supplies or outdoor gear. ? Tents. When you are outside  Wear long sleeves and long pants.  Wear light-colored clothes.  Tuck your pant legs into your socks.  Stay in the middle of the trail. Do not touch the bushes.  Avoid walking through long grass.  Check for ticks on your clothes, hair, and skin often while you are outside. Before going inside your house, check your clothes, skin, head, neck, armpits, waist, groin, and joint areas. When you go indoors  Check your clothes for ticks. Dry your clothes in a dryer on high heat for 10 minutes or more. If clothes are damp, additional time may be needed.  Wash your clothes right away if  they need to be washed. Use hot water.  Check your pets and outdoor gear.  Shower right away.  Check your body for ticks. Do a full body check using a mirror. What is the right way to remove a tick? Remove the tick from your skin as soon as possible. Do not remove the tick with your bare fingers.  To remove a tick that is crawling on your skin: ? Go outdoors and brush the tick off. ? Use tape or  a lint roller.  To remove a tick that is biting: 1. Wash your hands. 2. If you have latex gloves, put them on. 3. Use tweezers, curved forceps, or a tick-removal tool to grasp the tick. Grasp the tick as close to your skin and as close to the tick's head as possible. 4. Gently pull up until the tick lets go.  Try to keep the tick's head attached to its body.  Do not twist or jerk the tick.  Do not squeeze or crush the tick. Do not try to remove a tick with heat, alcohol, petroleum jelly, or fingernail polish.   What should I do after taking out a tick?  Throw away the tick. Do not crush a tick with your fingers.  Clean the bite area and your hands with soap and water, rubbing alcohol, or an iodine wash.  If an antiseptic cream or ointment is available, apply a small amount to the bite area.  Wash and disinfect any instruments that you used to remove the tick. How should I get rid of a live tick? To dispose of a live tick, use one of these methods:  Place the tick in rubbing alcohol.  Place the tick in a bag or container you can close tightly.  Wrap the tick tightly in tape.  Flush the tick down the toilet. Contact a doctor if:  You have symptoms, such as: ? A fever or chills. ? A red rash that makes a circle (bull's-eye rash) in the bite area. ? Redness and swelling where the tick bit you. ? Headache. ? Pain in a muscle, joint, or bone. ? Being more tired than normal. ? Trouble walking or moving your legs. ? Numbness in your legs. ? Tender and swollen lymph glands.  A  part of a tick breaks off and gets stuck in your skin. Get help right away if:  You cannot remove a tick.  You cannot move (have paralysis) or feel weak.  You are feeling worse or have new symptoms.  You find a tick that is biting you and filled with blood. This is important if you are in an area where diseases from ticks are common. Summary  Ticks may carry germs that can make you sick.  To prevent tick bites wear long sleeves, long pants, and light colors. Use insect repellent. Follow the instructions on the label.  If the tick is biting, do not try to remove it with heat, alcohol, petroleum jelly, or fingernail polish.  Use tweezers, curved forceps, or a tick-removal tool to grasp the tick. Gently pull up until the tick lets go. Do not twist or jerk the tick. Do not squeeze or crush the tick.  If you have symptoms, contact a doctor. This information is not intended to replace advice given to you by your health care provider. Make sure you discuss any questions you have with your health care provider. Document Revised: 04/01/2019 Document Reviewed: 04/01/2019 Elsevier Patient Education  2021 [employer].

## 2020-08-24 NOTE — Progress Notes (Signed)
New Patient Office Visit  Subjective:  Patient ID: Krystal Mays, female    DOB: [birth year]  Age: 55 y.o. MRN: [id number]  CC:  Chief Complaint  Patient presents with  . Urinary Tract Infection    Pt c/o back pain x1day    HPI Krystal Mays presents for couple of days has some low back pain and pelvic pressure, this is how she was last urgent care visit when she had a urinary infection.  She denies any need for STD testing.  She did incidentally pull a tick off her back last week. Has small red area where tick was removed.   Patient  denies any fever,chills, rash, chest pain, shortness of breath, nausea, vomiting, or diarrhea.   Denies dizziness, lightheadedness, pre syncopal or syncopal episodes.      Past Medical History:  Diagnosis Date  . ADHD (attention deficit hyperactivity disorder)   . Depression   . Genital warts   . GERD (gastroesophageal reflux disease)   . Hernia, umbilical     Past Surgical History:  Procedure Laterality Date  . CESAREAN SECTION     two  . CHOLECYSTECTOMY    . COLONOSCOPY WITH PROPOFOL N/A 02/27/2020   Procedure: COLONOSCOPY WITH PROPOFOL;  Surgeon: Pasty Spillers, MD;  Location: [facility] ENDOSCOPY;  Service: Endoscopy;  Laterality: N/A;  . ENDOMETRIAL ABLATION  2008  . UMBILICAL HERNIA REPAIR      Family History  Problem Relation Age of Onset  . Cancer Other        breast  . Cancer Mother 85       breast   . Breast cancer Mother 48  . Colon cancer Neg Hx   . Esophageal cancer Neg Hx   . Liver cancer Neg Hx   . Pancreatic cancer Neg Hx   . Rectal cancer Neg Hx   . Stomach cancer Neg Hx     Social History   Socioeconomic History  . Marital status: Married    Spouse name: Not on file  . Number of children: 2  . Years of education: Not on file  . Highest education level: Not on file  Occupational History  . 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 Smoking status:  Former Smoker    Quit date: 2001    Years since quitting: 21.3  . Smokeless tobacco: Never Used  Vaping Use  . Vaping Use: Never used  Substance and Sexual Activity  . Alcohol use: Yes    Alcohol/week: 0.0 standard drinks    Comment: socially  . Drug use: Never  . Sexual activity: Not on file  Other Topics Concern  . Not on file  Social History Narrative  . Not on file   Social Determinants of Health   Financial Resource Strain: Not on file  Food Insecurity: Not on file  Transportation Needs: Not on file  Physical Activity: Not on file  Stress: Not on file  Social Connections: Not on file  Intimate Partner Violence: Not on file    ROS Review of Systems  Constitutional: Positive for fatigue. Negative for activity change, appetite change, chills, diaphoresis, fever and unexpected weight change.  Respiratory: Negative.   Cardiovascular: Negative.   Gastrointestinal: Positive for abdominal pain.  Genitourinary: Positive for frequency.  Musculoskeletal: Positive for back pain.  Neurological: Negative.   Hematological: Negative.   Psychiatric/Behavioral: Negative.     Objective:   Today's Vitals: BP 92/64 (BP  Location: Left Arm, Patient Position: Sitting)   Pulse 80   Temp 98 F (36.7 C)   Ht 5' 4.02" (1.626 m)   Wt 218 lb 12.8 oz (99.2 kg)   SpO2 97%   BMI 37.54 kg/m   Physical Exam Vitals and nursing note reviewed.  Constitutional:      Appearance: Normal appearance. She is not ill-appearing.     Comments: Patient is alert and oriented and responsive to questions Engages in eye contact with provider. Speaks in full sentences without any pauses without any shortness of breath or distress.    HENT:     Head: Normocephalic and atraumatic.     Right Ear: External ear normal.     Left Ear: External ear normal.     Nose: Nose normal.     Mouth/Throat:     Mouth: Mucous membranes are moist.  Eyes:     General: No scleral icterus.       Right eye: No discharge.         Left eye: No discharge.     Conjunctiva/sclera: Conjunctivae normal.     Pupils: Pupils are equal, round, and reactive to light.  Cardiovascular:     Rate and Rhythm: Normal rate and regular rhythm.     Pulses: Normal pulses.     Heart sounds: Normal heart sounds. No murmur heard. No friction rub. No gallop.   Pulmonary:     Effort: Pulmonary effort is normal. No respiratory distress.     Breath sounds: Normal breath sounds. No stridor. No wheezing, rhonchi or rales.  Chest:     Chest wall: No tenderness.  Abdominal:     General: Bowel sounds are normal. There is no distension.     Palpations: Abdomen is soft.     Tenderness: There is abdominal tenderness in the suprapubic area. There is no right CVA tenderness, left CVA tenderness or guarding. Negative signs include Murphy's sign.    Genitourinary:    Comments: Deferred.  Musculoskeletal:        General: No tenderness. Normal range of motion.     Cervical back: Normal range of motion and neck supple.     Right lower leg: No edema.     Left lower leg: No edema.  Skin:    General: Skin is warm.     Findings: No erythema, lesion or rash.       Neurological:     Mental Status: She is alert and oriented to person, place, and time.     Motor: No weakness.     Gait: Gait normal.  Psychiatric:        Mood and Affect: Mood normal.        Behavior: Behavior normal.        Thought Content: Thought content normal.        Judgment: Judgment normal.     Assessment & Plan:   Problem List Items Addressed This Visit      Musculoskeletal and Integument   Tick bite of right back wall of thorax   Localized infection of skin     Other   Fatigue   Low back pain without sciatica - Primary   Relevant Medications   amoxicillin-clavulanate (AUGMENTIN) 875-125 MG tablet   Other Relevant Orders   POCT Urinalysis Dipstick (Completed)   CULTURE, URINE COMPREHENSIVE   Urinalysis, Routine w reflex microscopic   Urinary symptom or  sign      1. Low back pain without sciatica, unspecified  back pain laterality, unspecified chronicity  - POCT Urinalysis Dipstick - CULTURE, URINE COMPREHENSIVE - amoxicillin-clavulanate (AUGMENTIN) 875-125 MG tablet; Take 1 tablet by mouth 2 (two) times daily.  Dispense: 20 tablet; Refill: 0 - Urinalysis, Routine w reflex microscopic  2. Fatigue, unspecified type Increased rest, hydration.  Return for labs if any worsening.   3. Tick bite of right back wall of thorax, initial encounter Right upper back, no erythema migrans. Discussed tick borne illness and what  watch for and when to return for further work up and labs and RED flags to be seen immediately.  4. Localized infection of skin At area tick was removed.   5. Urinary symptom or sign Will send for culture and start Augmentin as directed. Will change antibiotic per culture or discontinue if needed.    Orders Placed This Encounter  Procedures  . CULTURE, URINE COMPREHENSIVE  . Urinalysis, Routine w reflex microscopic  . POCT Urinalysis Dipstick   Outpatient Encounter Medications as of 08/24/2020  Medication Sig  . amoxicillin-clavulanate (AUGMENTIN) 875-125 MG tablet Take 1 tablet by mouth 2 (two) times daily.  Marland Kitchen buPROPion (WELLBUTRIN XL) 300 MG 24 hr tablet Take 1 tablet (300 mg total) by mouth daily.  Marland Kitchen escitalopram (LEXAPRO) 10 MG tablet Take 1 tablet (10 mg total) by mouth daily.  Marland Kitchen omeprazole (PRILOSEC) 20 MG capsule Take 1 capsule (20 mg total) by mouth daily. Take 2 capsules by mouth daily as instructed  . polyethylene glycol-electrolytes (NULYTELY) 420 g solution Prepare according to package instructions. Starting at 5:00 PM: Drink one 8 oz glass of mixture every 15 minutes until you finish half of the jug. Five hours prior to procedure, drink 8 oz glass of mixture every 15 minutes until it is all gone. Make sure you do not drink anything 4 hours prior to your procedure. (Patient not taking: No sig reported)   No  facility-administered encounter medications on file as of 08/24/2020.      Return precautions given. Red Flags discussed. The patient was given clear instructions to go to ER or return to medical center if any red flags develop, symptoms do not improve, worsen or new problems develop. They verbalized understanding.    Risks, benefits, and alternatives of the medications and treatment plan prescribed today were discussed, and patient expressed understanding.    Education regarding symptom management and diagnosis given to patient on AVS.  Patient was in agreement with treatment plan.   Continue to follow with  Eula Fried. Batya Citron AGNP-C, FNP-C for routine health maintenance.   Eula Fried. Geraline Halberstadt AGNP-C, FNP-C Follow-up: Return in about 4 days (around 08/28/2020), or if symptoms worsen or fail to improve, for at any time for any worsening symptoms, Go to Emergency room/ urgent care if worse.   Jairo Ben, FNP

## 2020-08-25 LAB — URINALYSIS, ROUTINE W REFLEX MICROSCOPIC
Bilirubin Urine: NEGATIVE
Hgb urine dipstick: NEGATIVE
Ketones, ur: NEGATIVE
Leukocytes,Ua: NEGATIVE
Nitrite: NEGATIVE
RBC / HPF: NONE SEEN (ref 0–?)
Specific Gravity, Urine: >=1.03 — AB (ref 1.000–1.030)
Total Protein, Urine: NEGATIVE
Urine Glucose: NEGATIVE
Urobilinogen, UA: 0.2 (ref 0.0–1.0)
WBC, UA: NONE SEEN (ref 0–?)
pH: 5.5 (ref 5.0–8.0)

## 2020-08-26 LAB — CULTURE, URINE COMPREHENSIVE
MICRO NUMBER:: 11866071
SPECIMEN QUALITY:: ADEQUATE

## 2020-08-31 NOTE — Progress Notes (Signed)
Urine culture shows gram positive bacteria- how are you feeling ? The Augmentin I prescribed should cobver this bacteria.  Let me know if you are still having symptoms.

## 2021-01-08 ENCOUNTER — Encounter: Payer: Self-pay | Admitting: Adult Health

## 2021-02-12 ENCOUNTER — Other Ambulatory Visit: Payer: BC Managed Care – PPO

## 2021-02-15 ENCOUNTER — Ambulatory Visit: Payer: BC Managed Care – PPO | Admitting: Adult Health

## 2021-02-16 ENCOUNTER — Telehealth: Payer: Self-pay | Admitting: Adult Health

## 2021-02-16 DIAGNOSIS — K219 Gastro-esophageal reflux disease without esophagitis: Secondary | ICD-10-CM

## 2021-02-16 DIAGNOSIS — F418 Other specified anxiety disorders: Secondary | ICD-10-CM

## 2021-02-16 MED ORDER — ESCITALOPRAM OXALATE 10 MG PO TABS: 10.0000 mg | ORAL_TABLET | Freq: Every day | ORAL | 1 refills | Status: DC

## 2021-02-16 MED ORDER — OMEPRAZOLE 20 MG PO CPDR: 20.0000 mg | DELAYED_RELEASE_CAPSULE | Freq: Every day | ORAL | 1 refills | Status: DC

## 2021-02-16 MED ORDER — BUPROPION HCL ER (XL) 300 MG PO TB24: 300.0000 mg | ORAL_TABLET | Freq: Every day | ORAL | 1 refills | Status: DC

## 2021-02-16 NOTE — Telephone Encounter (Signed)
Patient is Krystal Mays Patient needing refills last seen 08/24/20   buPROPion (WELLBUTRIN XL) 300 MG 24 hr tablet escitalopram (LEXAPRO) 10 MG tablet omeprazole (PRILOSEC) 20 MG capsule

## 2021-03-06 IMAGING — MG DIGITAL SCREENING BILAT W/ TOMO W/ CAD
8 series · 8 of 24 positions shown · non-contrast
Comparison: Previous exam(s).

CLINICAL DATA: Screening.

EXAM:
DIGITAL SCREENING BILATERAL MAMMOGRAM WITH TOMO AND CAD

[R CC synth-2D]
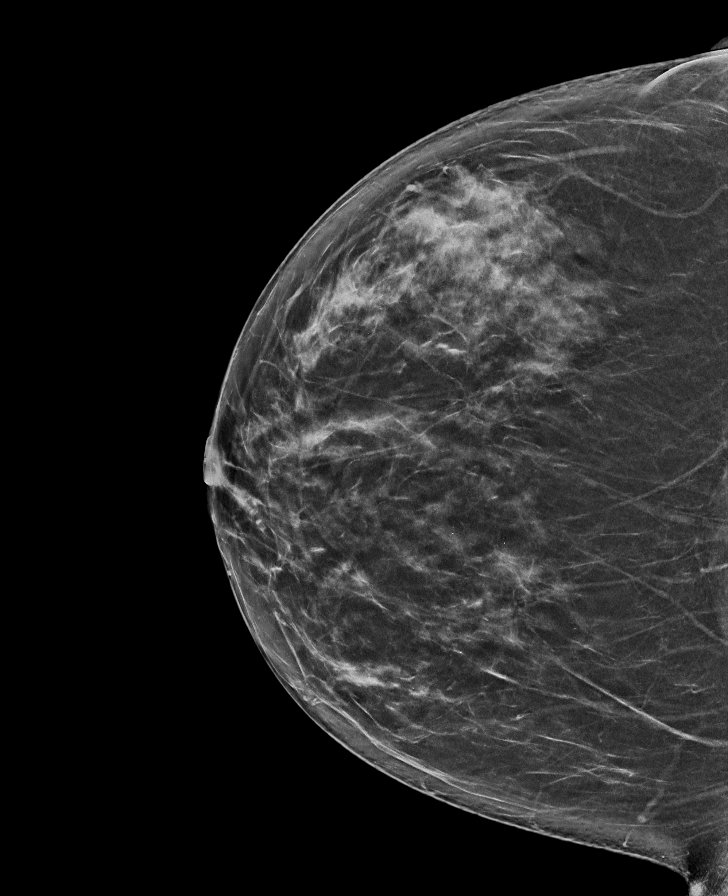

[L CC synth-2D]
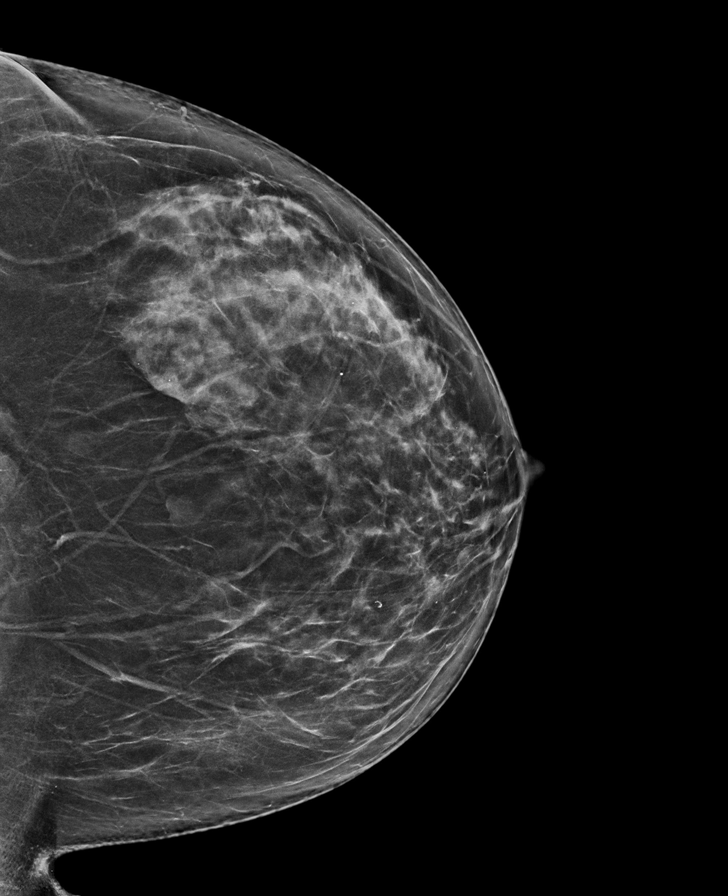

[R MLO synth-2D]
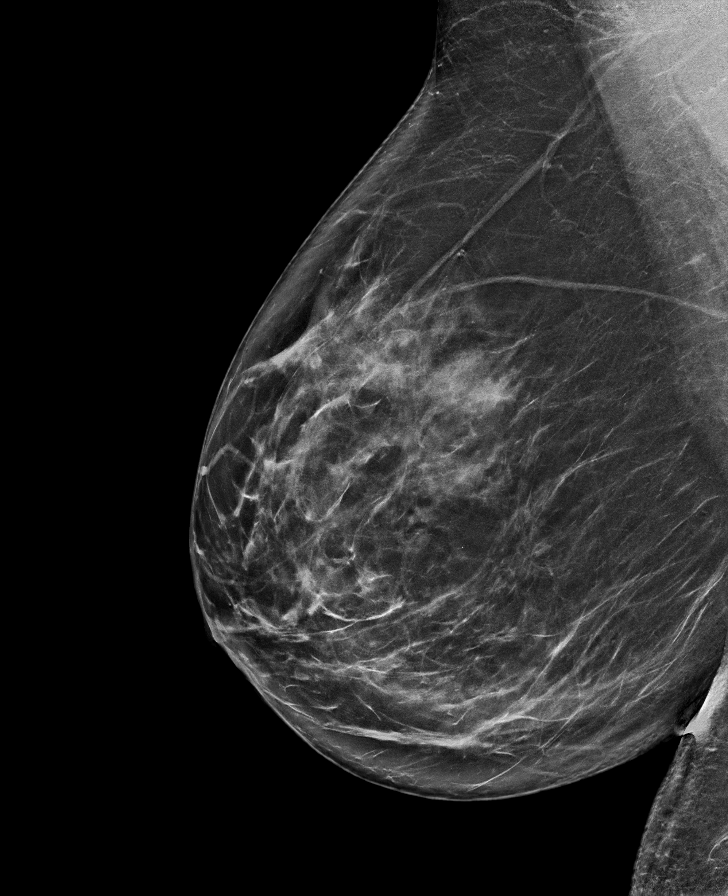

[L MLO synth-2D]
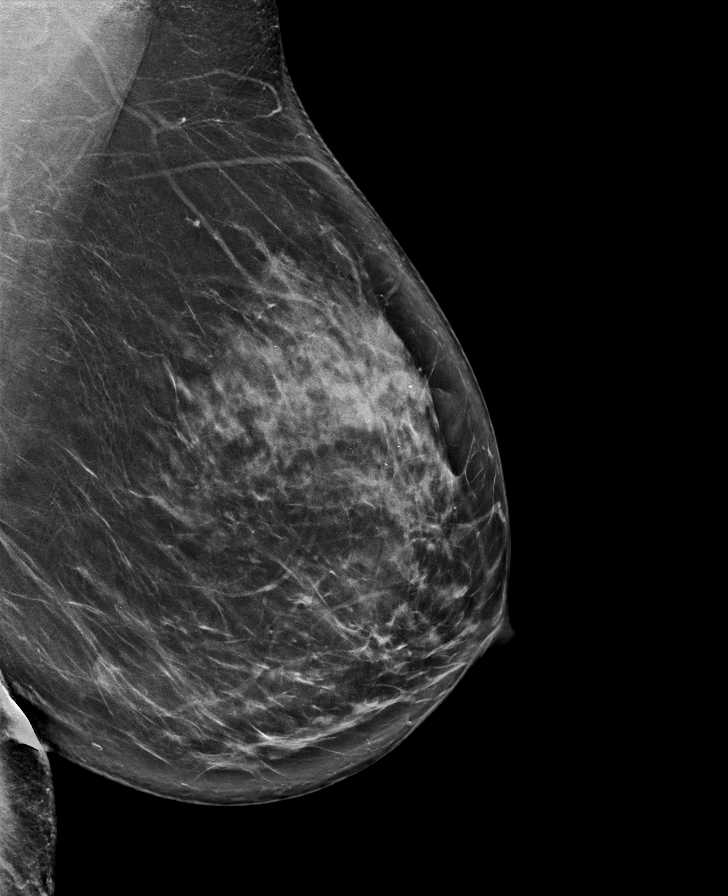

[R MLO tomo · tomo slice 43/86.0]
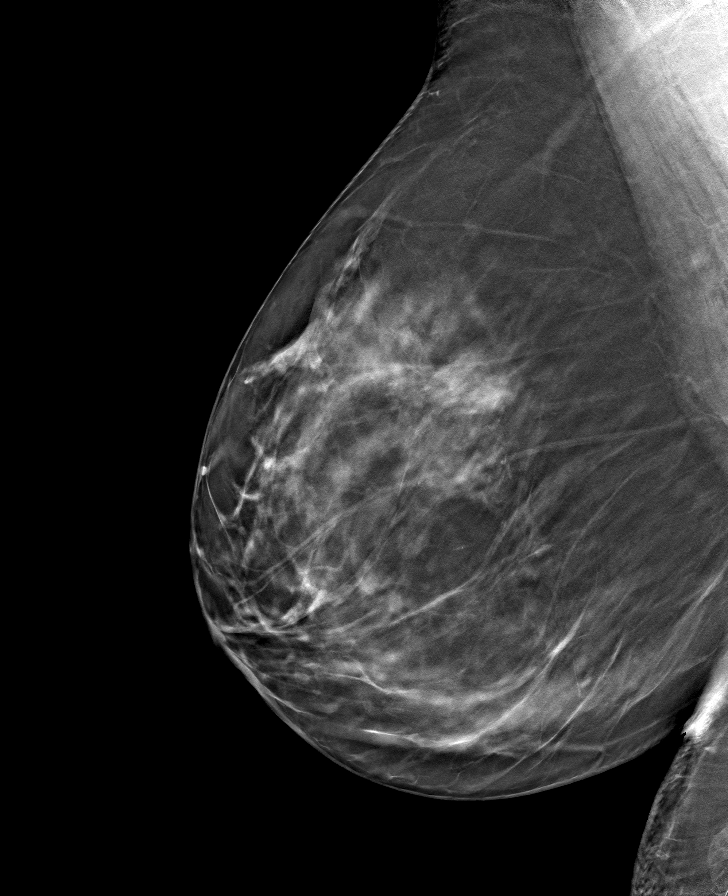

[L MLO tomo · tomo slice 46/91.0]
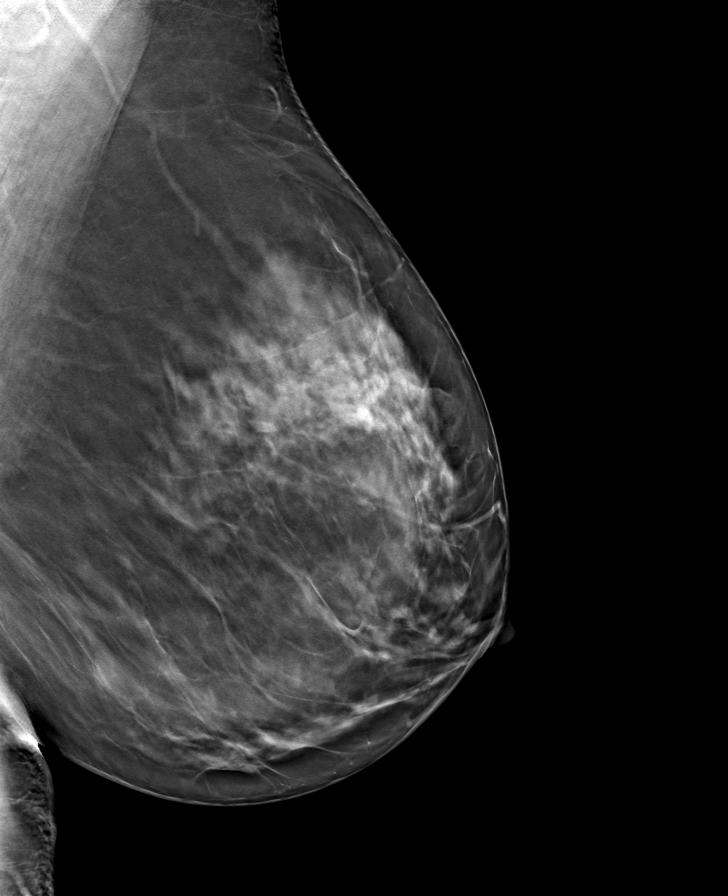

[R CC tomo · tomo slice 39/77.0]
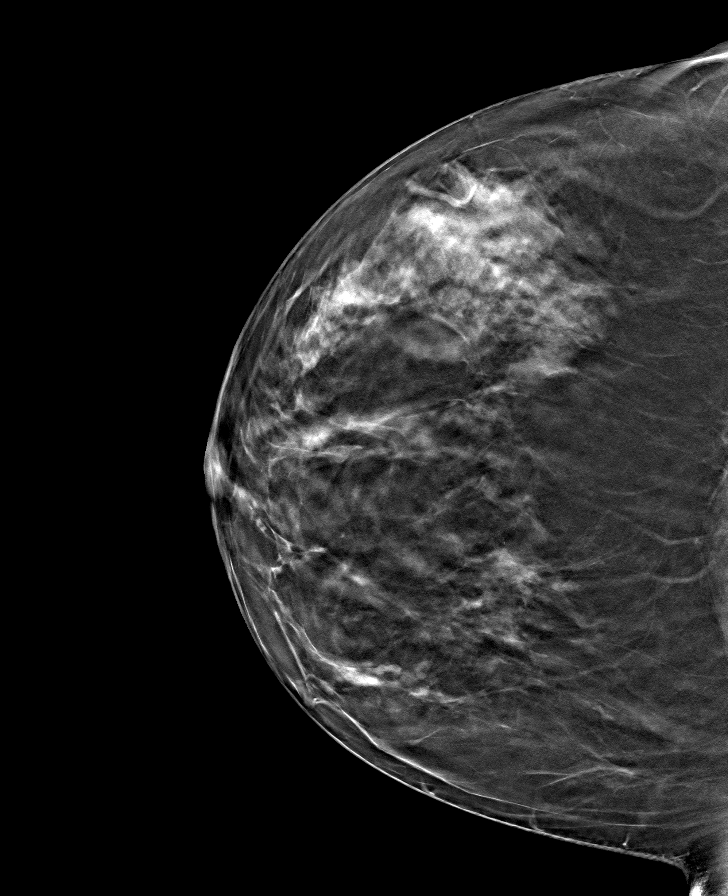

[L CC tomo · tomo slice 41/82.0]
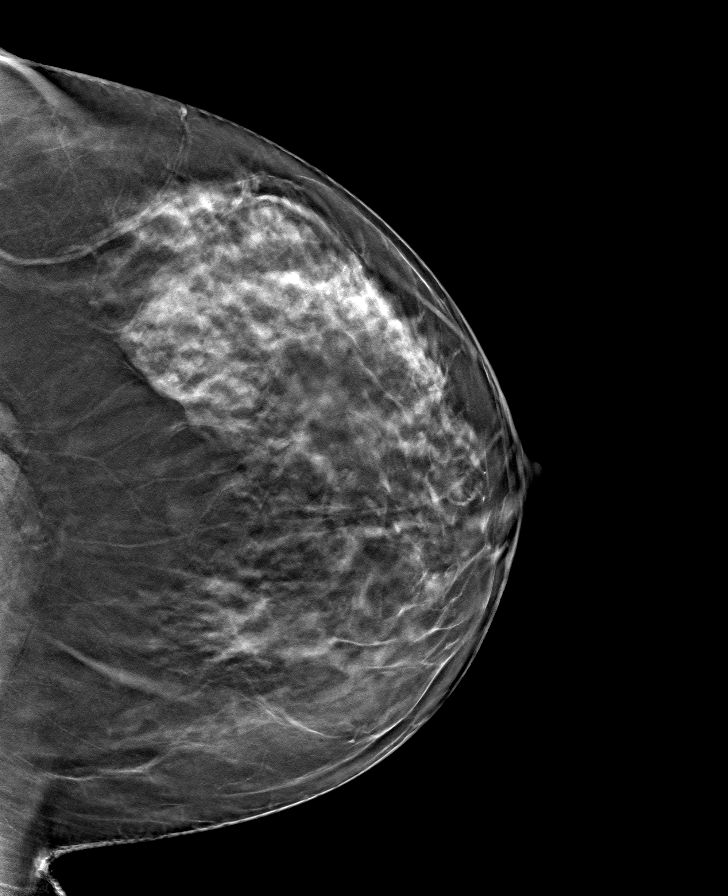

[8 of 24 positions shown; findings below may reference images not displayed]

ACR Breast Density Category b: There are scattered areas of
fibroglandular density.
FINDINGS: There are no findings suspicious for malignancy. Images were
processed with CAD.
IMPRESSION: No mammographic evidence of malignancy. A result letter of this
screening mammogram will be mailed directly to the patient.

RECOMMENDATION:
Screening mammogram in one year. (Code:[id number])

BI-RADS CATEGORY  1: Negative.

## 2021-05-13 ENCOUNTER — Encounter: Payer: Self-pay | Admitting: Adult Health

## 2021-05-13 DIAGNOSIS — F418 Other specified anxiety disorders: Secondary | ICD-10-CM

## 2021-05-13 DIAGNOSIS — K219 Gastro-esophageal reflux disease without esophagitis: Secondary | ICD-10-CM

## 2021-05-14 MED ORDER — ESCITALOPRAM OXALATE 10 MG PO TABS: 10.0000 mg | ORAL_TABLET | Freq: Every day | ORAL | 0 refills | Status: DC

## 2021-05-14 MED ORDER — OMEPRAZOLE 20 MG PO CPDR: 20.0000 mg | DELAYED_RELEASE_CAPSULE | Freq: Every day | ORAL | 0 refills | Status: DC

## 2021-05-14 MED ORDER — BUPROPION HCL ER (XL) 300 MG PO TB24: 300.0000 mg | ORAL_TABLET | Freq: Every day | ORAL | 0 refills | Status: DC

## 2021-05-24 ENCOUNTER — Ambulatory Visit (INDEPENDENT_AMBULATORY_CARE_PROVIDER_SITE_OTHER): Payer: BC Managed Care – PPO | Admitting: Adult Health

## 2021-05-24 DIAGNOSIS — Z1231 Encounter for screening mammogram for malignant neoplasm of breast: Secondary | ICD-10-CM

## 2021-05-24 DIAGNOSIS — K219 Gastro-esophageal reflux disease without esophagitis: Secondary | ICD-10-CM

## 2021-05-24 DIAGNOSIS — F418 Other specified anxiety disorders: Secondary | ICD-10-CM

## 2021-05-24 DIAGNOSIS — E559 Vitamin D deficiency, unspecified: Secondary | ICD-10-CM

## 2021-05-24 DIAGNOSIS — Z6837 Body mass index (BMI) 37.0-37.9, adult: Secondary | ICD-10-CM

## 2021-05-24 DIAGNOSIS — Z1389 Encounter for screening for other disorder: Secondary | ICD-10-CM

## 2021-05-24 DIAGNOSIS — E538 Deficiency of other specified B group vitamins: Secondary | ICD-10-CM

## 2021-05-24 NOTE — Progress Notes (Signed)
Duplicate

## 2021-05-24 NOTE — Patient Instructions (Signed)

## 2021-05-26 ENCOUNTER — Other Ambulatory Visit: Payer: Self-pay

## 2021-05-26 ENCOUNTER — Ambulatory Visit (INDEPENDENT_AMBULATORY_CARE_PROVIDER_SITE_OTHER): Payer: BC Managed Care – PPO | Admitting: Adult Health

## 2021-05-26 ENCOUNTER — Other Ambulatory Visit (HOSPITAL_COMMUNITY)
Admission: RE | Admit: 2021-05-26 | Discharge: 2021-05-26 | Disposition: A | Discharge status: Home / Self Care | Payer: BC Managed Care – PPO | Source: Ambulatory Visit | Attending: Adult Health | Admitting: Adult Health

## 2021-05-26 ENCOUNTER — Encounter: Payer: Self-pay | Admitting: Adult Health

## 2021-05-26 VITALS — BP 116/82 | HR 68 | Temp 98.0°F | Resp 16 | Ht 64.02 in | Wt 221.0 lb

## 2021-05-26 DIAGNOSIS — E559 Vitamin D deficiency, unspecified: Secondary | ICD-10-CM

## 2021-05-26 DIAGNOSIS — Z0001 Encounter for general adult medical examination with abnormal findings: Secondary | ICD-10-CM | POA: Diagnosis not present

## 2021-05-26 DIAGNOSIS — E538 Deficiency of other specified B group vitamins: Secondary | ICD-10-CM | POA: Diagnosis not present

## 2021-05-26 DIAGNOSIS — Z6837 Body mass index (BMI) 37.0-37.9, adult: Secondary | ICD-10-CM

## 2021-05-26 DIAGNOSIS — Z124 Encounter for screening for malignant neoplasm of cervix: Secondary | ICD-10-CM | POA: Diagnosis not present

## 2021-05-26 DIAGNOSIS — K219 Gastro-esophageal reflux disease without esophagitis: Secondary | ICD-10-CM

## 2021-05-26 DIAGNOSIS — F418 Other specified anxiety disorders: Secondary | ICD-10-CM

## 2021-05-26 DIAGNOSIS — B3731 Acute candidiasis of vulva and vagina: Secondary | ICD-10-CM

## 2021-05-26 LAB — COMPREHENSIVE METABOLIC PANEL
ALT: 10 U/L (ref 0–35)
AST: 12 U/L (ref 0–37)
Albumin: 4.3 g/dL (ref 3.5–5.2)
Alkaline Phosphatase: 67 U/L (ref 39–117)
BUN: 16 mg/dL (ref 6–23)
CO2: 25 mEq/L (ref 19–32)
Calcium: 9.3 mg/dL (ref 8.4–10.5)
Chloride: 107 mEq/L (ref 96–112)
Creatinine, Ser: 1.12 mg/dL (ref 0.40–1.20)
GFR: 55.87 mL/min — ABNORMAL LOW (ref >60.00)
Glucose, Bld: 104 mg/dL — ABNORMAL HIGH (ref 70–99)
Potassium: 4.2 mEq/L (ref 3.5–5.1)
Sodium: 142 mEq/L (ref 135–145)
Total Bilirubin: 0.5 mg/dL (ref 0.2–1.2)
Total Protein: 6.5 g/dL (ref 6.0–8.3)

## 2021-05-26 LAB — CBC WITH DIFFERENTIAL/PLATELET
Basophils Absolute: 0.1 10*3/uL (ref 0.0–0.1)
Basophils Relative: 1.3 % (ref 0.0–3.0)
Eosinophils Absolute: 0.1 10*3/uL (ref 0.0–0.7)
Eosinophils Relative: 2.1 % (ref 0.0–5.0)
HCT: 38.4 % (ref 36.0–46.0)
Hemoglobin: 13 g/dL (ref 12.0–15.0)
Lymphocytes Relative: 31.5 % (ref 12.0–46.0)
Lymphs Abs: 1.5 10*3/uL (ref 0.7–4.0)
MCHC: 34 g/dL (ref 30.0–36.0)
MCV: 91.5 fl (ref 78.0–100.0)
Monocytes Absolute: 0.4 10*3/uL (ref 0.1–1.0)
Monocytes Relative: 9.3 % (ref 3.0–12.0)
Neutro Abs: 2.7 10*3/uL (ref 1.4–7.7)
Neutrophils Relative %: 55.8 % (ref 43.0–77.0)
Platelets: 168 10*3/uL (ref 150.0–400.0)
RBC: 4.19 Mil/uL (ref 3.87–5.11)
RDW: 13.2 % (ref 11.5–15.5)
WBC: 4.8 10*3/uL (ref 4.0–10.5)

## 2021-05-26 LAB — B12 AND FOLATE PANEL
Folate: 6.2 ng/mL (ref >5.9)
Vitamin B-12: 239 pg/mL (ref 211–911)

## 2021-05-26 LAB — TSH: TSH: 1.89 u[IU]/mL (ref 0.35–5.50)

## 2021-05-26 LAB — VITAMIN D 25 HYDROXY (VIT D DEFICIENCY, FRACTURES): VITD: 18.46 ng/mL — ABNORMAL LOW (ref 30.00–100.00)

## 2021-05-26 MED ORDER — BUPROPION HCL ER (XL) 300 MG PO TB24: 300.0000 mg | ORAL_TABLET | Freq: Every day | ORAL | 4 refills | Status: AC

## 2021-05-26 MED ORDER — ESCITALOPRAM OXALATE 10 MG PO TABS: 10.0000 mg | ORAL_TABLET | Freq: Every day | ORAL | 4 refills | Status: AC

## 2021-05-26 MED ORDER — OMEPRAZOLE 20 MG PO CPDR: 20.0000 mg | DELAYED_RELEASE_CAPSULE | Freq: Every day | ORAL | 4 refills | Status: AC

## 2021-05-26 MED ORDER — TERCONAZOLE 0.4 % VA CREA: 1.0000 | TOPICAL_CREAM | Freq: Every day | VAGINAL | 0 refills | Status: AC

## 2021-05-26 NOTE — Patient Instructions (Addendum)
 Call to schedule your screening mammogram. Your orders have been placed for your exam.  Let our office know if you have questions, concerns, or any difficulty scheduling.  If normal results then yearly screening mammograms are recommended unless you notice  Changes in your breast then you should schedule a follow up office visit. If abnormal results  Further imaging will be warranted and sooner follow up as determined by the radiologist at the Breast Center.   [facility] [address] [place], [place] [address]  Main: [phone]   Health Maintenance, Female Adopting a healthy lifestyle and getting preventive care are important in promoting health and wellness. Ask your health care provider about: The right schedule for you to have regular tests and exams. Things you can do on your own to prevent diseases and keep yourself healthy. What should I know about diet, weight, and exercise? Eat a healthy diet  Eat a diet that includes plenty of vegetables, fruits, low-fat dairy products, and lean protein. Do not eat a lot of foods that are high in solid fats, added sugars, or sodium. Maintain a healthy weight Body mass index (BMI) is used to identify weight problems. It estimates body fat based on height and weight. Your health care provider can help determine your BMI and help you achieve or maintain a healthy weight. Get regular exercise Get regular exercise. This is one of the most important things you can do for your health. Most adults should: Exercise for at least 150 minutes each week. The exercise should increase your heart rate and make you sweat (moderate-intensity exercise). Do strengthening exercises at least twice a week. This is in addition to the moderate-intensity exercise. Spend less time sitting. Even light physical activity can be beneficial. Watch cholesterol and blood lipids Have your blood tested for lipids and cholesterol at 55  years of age, then have this test every 5 years. Have your cholesterol levels checked more often if: Your lipid or cholesterol levels are high. You are older than 55 years of age. You are at high risk for heart disease. What should I know about cancer screening? Depending on your health history and family history, you may need to have cancer screening at various ages. This may include screening for: Breast cancer. Cervical cancer. Colorectal cancer. Skin cancer. Lung cancer. What should I know about heart disease, diabetes, and high blood pressure? Blood pressure and heart disease High blood pressure causes heart disease and increases the risk of stroke. This is more likely to develop in people who have high blood pressure readings or are overweight. Have your blood pressure checked: Every 3-5 years if you are 18-39 years of age. Every year if you are 40 years old or older. Diabetes Have regular diabetes screenings. This checks your fasting blood sugar level. Have the screening done: Once every three years after age 40 if you are at a normal weight and have a low risk for diabetes. More often and at a younger age if you are overweight or have a high risk for diabetes. What should I know about preventing infection? Hepatitis B If you have a higher risk for hepatitis B, you should be screened for this virus. Talk with your health care provider to find out if you are at risk for hepatitis B infection. Hepatitis C Testing is recommended for: Everyone born from 1945 through 1965. Anyone with known risk factors for hepatitis C. Sexually transmitted infections (STIs) Get screened for STIs,   including gonorrhea and chlamydia, if: You are sexually active and are younger than 55 years of age. You are older than 55 years of age and your health care provider tells you that you are at risk for this type of infection. Your sexual activity has changed since you were last screened, and you are at  increased risk for chlamydia or gonorrhea. Ask your health care provider if you are at risk. Ask your health care provider about whether you are at high risk for HIV. Your health care provider may recommend a prescription medicine to help prevent HIV infection. If you choose to take medicine to prevent HIV, you should first get tested for HIV. You should then be tested every 3 months for as long as you are taking the medicine. Pregnancy If you are about to stop having your period (premenopausal) and you may become pregnant, seek counseling before you get pregnant. Take 400 to 800 micrograms (mcg) of folic acid every day if you become pregnant. Ask for birth control (contraception) if you want to prevent pregnancy. Osteoporosis and menopause Osteoporosis is a disease in which the bones lose minerals and strength with aging. This can result in bone fractures. If you are 65 years old or older, or if you are at risk for osteoporosis and fractures, ask your health care provider if you should: Be screened for bone loss. Take a calcium or vitamin D supplement to lower your risk of fractures. Be given hormone replacement therapy (HRT) to treat symptoms of menopause. Follow these instructions at home: Alcohol use Do not drink alcohol if: Your health care provider tells you not to drink. You are pregnant, may be pregnant, or are planning to become pregnant. If you drink alcohol: Limit how much you have to: 0-1 drink a day. Know how much alcohol is in your drink. In the [place] one drink equals one 12 oz bottle of beer (355 mL), one 5 oz glass of wine (148 mL), or one 1 oz glass of hard liquor (44 mL). Lifestyle Do not use any products that contain nicotine or tobacco. These products include cigarettes, chewing tobacco, and vaping devices, such as e-cigarettes. If you need help quitting, ask your health care provider. Do not use street drugs. Do not share needles. Ask your health care provider for help  if you need support or information about quitting drugs. General instructions Schedule regular health, dental, and eye exams. Stay current with your vaccines. Tell your health care provider if: You often feel depressed. You have ever been abused or do not feel safe at home. Summary Adopting a healthy lifestyle and getting preventive care are important in promoting health and wellness. Follow your health care provider's instructions about healthy diet, exercising, and getting tested or screened for diseases. Follow your health care provider's instructions on monitoring your cholesterol and blood pressure. This information is not intended to replace advice given to you by your health care provider. Make sure you discuss any questions you have with your health care provider. Document Revised: 08/24/2020 Document Reviewed: 08/24/2020 Elsevier Patient Education  2022 Elsevier Inc.    Fat and Cholesterol Restricted Eating Plan Getting too much fat and cholesterol in your diet may cause health problems. Choosing the right foods helps keep your fat and cholesterol at normal levels. This can keep you from getting certain diseases. Your doctor may recommend an eating plan that includes: Total fat: ______% or less of total calories a day. This is ______g of fat a day. Saturated   fat: ______% or less of total calories a day. This is ______g of saturated fat a day. Cholesterol: less than _________mg a day. Fiber: ______g a day. What are tips for following this plan? General tips Work with your doctor to lose weight if you need to. Avoid: Foods with added sugar. Fried foods. Foods with trans fat or partially hydrogenated oils. This includes some margarines and baked goods. If you drink alcohol: Limit how much you have to: 0-1 drink a day for women who are not pregnant. 0-2 drinks a day for men. Know how much alcohol is in a drink. In the [place] one drink equals one 12 oz bottle of beer (355 mL),  one 5 oz glass of wine (148 mL), or one 1 oz glass of hard liquor (44 mL). Reading food labels Check food labels for: Trans fats. Partially hydrogenated oils. Saturated fat (g) in each serving. Cholesterol (mg) in each serving. Fiber (g) in each serving. Choose foods with healthy fats, such as: Monounsaturated fats and polyunsaturated fats. These include olive and canola oil, flaxseeds, walnuts, almonds, and seeds. Omega-3 fats. These are found in certain fish, flaxseed oil, and ground flaxseeds. Choose grain products that have whole grains. Look for the word "whole" as the first word in the ingredient list. Cooking Cook foods using low-fat methods. These include baking, boiling, grilling, and broiling. Eat more home-cooked foods. Eat at restaurants and buffets less often. Eat less fast food. Avoid cooking using saturated fats, such as butter, cream, palm oil, palm kernel oil, and coconut oil. Meal planning  At meals, divide your plate into four equal parts: Fill one-half of your plate with vegetables, green salads, and fruit. Fill one-fourth of your plate with whole grains. Fill one-fourth of your plate with low-fat (lean) protein foods. Eat fish that is high in omega-3 fats at least two times a week. This includes mackerel, tuna, sardines, and salmon. Eat foods that are high in fiber, such as whole grains, beans, apples, pears, berries, broccoli, carrots, peas, and barley. What foods should I eat? Fruits All fresh, canned (in natural juice), or frozen fruits. Vegetables Fresh or frozen vegetables (raw, steamed, roasted, or grilled). Green salads. Grains Whole grains, such as whole wheat or whole grain breads, crackers, cereals, and pasta. Unsweetened oatmeal, bulgur, barley, quinoa, or brown rice. Corn or whole wheat flour tortillas. Meats and other protein foods Ground beef (85% or leaner), grass-fed beef, or beef trimmed of fat. Skinless chicken or turkey. Ground chicken or  turkey. Pork trimmed of fat. All fish and seafood. Egg whites. Dried beans, peas, or lentils. Unsalted nuts or seeds. Unsalted canned beans. Nut butters without added sugar or oil. Dairy Low-fat or nonfat dairy products, such as skim or 1% milk, 2% or reduced-fat cheeses, low-fat and fat-free ricotta or cottage cheese, or plain low-fat and nonfat yogurt. Fats and oils Tub margarine without trans fats. Light or reduced-fat mayonnaise and salad dressings. Avocado. Olive, canola, sesame, or safflower oils. The items listed above may not be a complete list of foods and beverages you can eat. Contact a dietitian for more information. What foods should I avoid? Fruits Canned fruit in heavy syrup. Fruit in cream or butter sauce. Fried fruit. Vegetables Vegetables cooked in cheese, cream, or butter sauce. Fried vegetables. Grains White bread. White pasta. White rice. Cornbread. Bagels, pastries, and croissants. Crackers and snack foods that contain trans fat and hydrogenated oils. Meats and other protein foods Fatty cuts of meat. Ribs, chicken wings, bacon, sausage, bologna,   salami, chitterlings, fatback, hot dogs, bratwurst, and packaged lunch meats. Liver and organ meats. Whole eggs and egg yolks. Chicken and turkey with skin. Fried meat. Dairy Whole or 2% milk, cream, half-and-half, and cream cheese. Whole milk cheeses. Whole-fat or sweetened yogurt. Full-fat cheeses. Nondairy creamers and whipped toppings. Processed cheese, cheese spreads, and cheese curds. Fats and oils Butter, stick margarine, lard, shortening, ghee, or bacon fat. Coconut, palm kernel, and palm oils. Beverages Alcohol. Sugar-sweetened drinks such as sodas, lemonade, and fruit drinks. Sweets and desserts Corn syrup, sugars, honey, and molasses. Candy. Jam and jelly. Syrup. Sweetened cereals. Cookies, pies, cakes, donuts, muffins, and ice cream. The items listed above may not be a complete list of foods and beverages you should  avoid. Contact a dietitian for more information. Summary Choosing the right foods helps keep your fat and cholesterol at normal levels. This can keep you from getting certain diseases. At meals, fill one-half of your plate with vegetables, green salads, and fruits. Eat high fiber foods, like whole grains, beans, apples, pears, berries, carrots, peas, and barley. Limit added sugar, saturated fats, alcohol, and fried foods. This information is not intended to replace advice given to you by your health care provider. Make sure you discuss any questions you have with your health care provider. Document Revised: 08/14/2020 Document Reviewed: 08/14/2020 Elsevier Patient Education  2022 Elsevier Inc.  

## 2021-05-26 NOTE — Progress Notes (Signed)
Subjective:    Patient ID: Krystal Mays, female    DOB: 1965-10-28, 56 y.o.   MRN: [id number]  CC: Krystal Mays is a 56 y.o. female who presents today for physical exam.    HPI: HPI Patient has a clinic today for a complete physical.  She has no concerns.  She does need a Pap smear today.  Denies any abnormal vaginal bleeding or pain.  She has no other concerns at today's visit.  Mammogram has been previously ordered she is aware to call for that screening mammogram.  She is doing well on her depression medication she has been on these for years and feels well.  She declines decreasing any dosage of the Lexapro at this time.  She reports she has tried this in the past and did not help her anxiety she feels wears he has no suicidal or homicidal ideations or intents.  She has no other concerns at today's visit she feels well.  Immunization History  Administered Date(s) Administered   Influenza Split 01/31/2012   Influenza Whole 02/23/2010   Influenza, Quadrivalent, Recombinant, Inj, Pf 02/01/2017   Influenza,inj,Quad PF,6+ Mos 02/03/2014, 01/26/2015, 01/11/2019   Influenza-Unspecified 01/23/2018, 12/29/2020   PFIZER(Purple Top)SARS-COV-2 Vaccination 06/17/2019, 07/16/2019, 02/11/2020   PPD Test 12/14/2015   Td 03/28/2007   Tdap 06/21/2017   Zoster Recombinat (Shingrix) 10/29/2019    Colorectal Cancer Screening: UTD Breast Cancer Screening: Mammogram ordered.  Cervical Cancer Screening: UTD 02/27/20 Bone Health screening/DEXA for 65+: No increased fracture risk. Defer screening at this time. Not indicated.   PAP - needed today, denies any abnormal vaginal bleeding.  No LMP recorded. Patient has had an ablation.  Tetanus - UTD   Hepatitis C screening - done HIV Screening- declined. Labs: Screening labs today. Exercise: Gets regular exercise when she can.  Smoking/tobacco use: Nonsmoker.    Patient  denies any fever, body aches,chills, rash, chest pain, shortness of  breath, nausea, vomiting, or diarrhea.  Denies dizziness, lightheadedness, pre syncopal or syncopal episodes.    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    Employer: [employer]  Tobacco Use   Smoking status: Former    Types: Cigarettes    Quit date: 2001    Years since quitting: 22.1   Smokeless tobacco: Never  Vaping Use   Vaping Use: Never used  Substance and Sexual Activity   Alcohol use: Yes    Alcohol/week: 0.0 standard drinks    Comment: socially   Drug use: Never   Sexual activity: Not on file  Other Topics Concern   Not on file  Social History Narrative   Not on file   Social Determinants of Health   Financial Resource Strain: Not on file  Food Insecurity: Not on file  Transportation Needs: Not on file  Physical Activity: Not on file  Stress: Not on file  Social Connections: Not on file    HISTORY:  Past Medical History:  Diagnosis Date   ADHD (attention deficit hyperactivity disorder)    Depression    Genital warts    GERD (gastroesophageal reflux disease)    Hernia, umbilical     Past Surgical History:  Procedure Laterality Date   CESAREAN SECTION     two  CHOLECYSTECTOMY     COLONOSCOPY WITH PROPOFOL N/A 02/27/2020   Procedure: COLONOSCOPY WITH PROPOFOL;  Surgeon: Pasty Spillers, MD;  Location: [facility] ENDOSCOPY;  Service: Endoscopy;  Laterality: N/A;   ENDOMETRIAL ABLATION  2008   UMBILICAL HERNIA REPAIR     Family History  Problem Relation Age of Onset   Cancer Other        breast   Cancer Mother 90       breast    Breast cancer Mother 34   Colon cancer Neg Hx    Esophageal cancer Neg Hx    Liver cancer Neg Hx    Pancreatic cancer Neg Hx    Rectal cancer Neg Hx    Stomach cancer Neg Hx       ALLERGIES: Flagyl  [metronidazole], Silicone, and Tape  No current outpatient medications on file prior to visit.   No current facility-administered medications on file prior to visit.    Social History   Tobacco Use   Smoking status: Former    Types: Cigarettes    Quit date: 2001    Years since quitting: 22.1   Smokeless tobacco: Never  Vaping Use   Vaping Use: Never used  Substance Use Topics   Alcohol use: Yes    Alcohol/week: 0.0 standard drinks    Comment: socially   Drug use: Never    Review of Systems  Constitutional: Negative.   HENT: Negative.    Respiratory: Negative.    Cardiovascular: Negative.   Gastrointestinal: Negative.   Genitourinary:  Positive for vaginal discharge (x 2 days denies any need for STD testing.).  Musculoskeletal: Negative.   Skin: Negative.   Neurological: Negative.   Hematological: Negative.   Psychiatric/Behavioral: Negative.       Objective:    BP 116/82 (BP Location: Left Arm, Patient Position: Sitting, Cuff Size: Small)    Pulse 68 Comment: manual   Temp 98 F (36.7 C) (Oral)    Resp 16    Ht 5' 4.02" (1.626 m)    Wt 221 lb (100.2 kg)    SpO2 97%    BMI 37.91 kg/m   BP Readings from Last 3 Encounters:  05/26/21 116/82  08/24/20 92/64  08/13/20 124/70   Wt Readings from Last 3 Encounters:  05/26/21 221 lb (100.2 kg)  08/24/20 218 lb 12.8 oz (99.2 kg)  08/13/20 221 lb 6.4 oz (100.4 kg)    Physical Exam Vitals reviewed.  Constitutional:      Appearance: She is well-developed. She is obese. She is not ill-appearing or diaphoretic.  Eyes:     Conjunctiva/sclera: Conjunctivae normal.  Neck:     Thyroid: No thyroid mass or thyromegaly.  Cardiovascular:     Rate and Rhythm: Normal rate and regular rhythm.     Pulses: Normal pulses.     Heart sounds: Normal heart sounds.  Pulmonary:     Effort: Pulmonary effort is normal.     Breath sounds: Normal breath sounds. No wheezing, rhonchi or rales.  Chest:     Comments: Patient declined breast  exam, she denies any change in shape size or nipple discharge.  Denies any skin dimpling.  She will schedule mammogram and return if any concerns. Genitourinary:    Cervix: No cervical motion tenderness, discharge or friability.     Uterus: Not enlarged, not fixed and not tender.      Adnexa:        Right: No mass, tenderness or fullness.  Left: No mass, tenderness or fullness.       Comments: Pap performed. No CMT. Unable to appreciated ovaries.  Scant white vaginal discharge.  Cervix appears normal. Musculoskeletal:        General: Normal range of motion.  Lymphadenopathy:     Head:     Right side of head: No submental, submandibular, tonsillar, preauricular, posterior auricular or occipital adenopathy.     Left side of head: No submental, submandibular, tonsillar, preauricular, posterior auricular or occipital adenopathy.     Cervical:     Right cervical: No superficial, deep or posterior cervical adenopathy.    Left cervical: No superficial, deep or posterior cervical adenopathy.  Skin:    General: Skin is warm and dry.  Neurological:     Mental Status: She is alert and oriented to person, place, and time.     Cranial Nerves: No cranial nerve deficit.     Sensory: No sensory deficit.     Motor: No weakness.     Coordination: Coordination normal.  Psychiatric:        Speech: Speech normal.        Behavior: Behavior normal.        Thought Content: Thought content normal.   Depression screen [facility] 2/9 05/26/2021 08/24/2020 08/13/2020 09/27/2019 06/21/2017  Decreased Interest 0 0 3 0 1  Down, Depressed, Hopeless 0 0 0 0 1  PHQ - 2 Score 0 0 3 0 2  Altered sleeping - - 0 0 0  Tired, decreased energy - - 0 0 0  Change in appetite - - 0 0 0  Feeling bad or failure about yourself  - - 0 0 0  Trouble concentrating - - 0 0 0  Moving slowly or fidgety/restless - - 0 0 0  Suicidal thoughts - - 0 0 0  PHQ-9 Score - - 3 0 2  Difficult doing work/chores - Not difficult at all Not  difficult at all Not difficult at all Not difficult at all    GAD 7 : Generalized Anxiety Score 09/27/2019  Nervous, Anxious, on Edge 0  Control/stop worrying 0  Worry too much - different things 0  Trouble relaxing 0  Restless 0  Easily annoyed or irritable 0  Afraid - awful might happen 0  Total GAD 7 Score 0  Anxiety Difficulty Not difficult at all        Assessment & Plan:   Problem List Items Addressed This Visit       Digestive   GERD (gastroesophageal reflux disease)   Relevant Medications   omeprazole (PRILOSEC) 20 MG capsule     Other   Depression with anxiety   Relevant Medications   buPROPion (WELLBUTRIN XL) 300 MG 24 hr tablet   escitalopram (LEXAPRO) 10 MG tablet   Other Visit Diagnoses     Body mass index (BMI) of 37.0-37.9 in adult    -  Primary   Screening for cervical cancer       Relevant Orders   Cytology - PAP( )   Vaginal yeast infection       Relevant Medications   terconazole (TERAZOL 7) 0.4 % vaginal cream   B12 deficiency       Vitamin D deficiency            I have changed Lakechia T. Perriello's omeprazole. I am also having her start on terconazole. Additionally, I am having her maintain her buPROPion and escitalopram.   Meds ordered this encounter  Medications   buPROPion (WELLBUTRIN XL) 300 MG 24 hr tablet    Sig: Take 1 tablet (300 mg total) by mouth daily.    Dispense:  90 tablet    Refill:  4   escitalopram (LEXAPRO) 10 MG tablet    Sig: Take 1 tablet (10 mg total) by mouth daily.    Dispense:  90 tablet    Refill:  4   omeprazole (PRILOSEC) 20 MG capsule    Sig: Take 1 capsule (20 mg total) by mouth daily.    Dispense:  90 capsule    Refill:  4   terconazole (TERAZOL 7) 0.4 % vaginal cream    Sig: Place 1 applicator vaginally at bedtime for 7 days.    Dispense:  45 g    Refill:  0  Return in about 6 months (around 11/23/2021), or if symptoms worsen or fail to improve, for at any time for any worsening symptoms, Go  to Emergency room/ urgent care if worse.  Advised to call and schedule mammogram. No orders of the defined types were placed in this encounter. Lab orders are in already she will go to the lab today for fasting labs.  Will defer urinalysis since she did have a Pap today and have a urinalysis done to screen for blood and protein after lab work is done.  Return precautions given.   The patient is advised to begin progressive daily aerobic exercise program, follow a low fat, low cholesterol diet, attempt to lose weight, reduce salt in diet and cooking, reduce exposure to stress, improve dietary compliance, use calcium 1 gram daily with Vit D, continue current medications, and continue current healthy lifestyle patterns.  Red Flags discussed. The patient was given clear instructions to go to ER or return to medical center if any red flags develop, symptoms do not improve, worsen or new problems develop. They verbalized understanding.  Risks, benefits, and alternatives of the medications and treatment plan prescribed today were discussed, and patient expressed understanding.   Education regarding symptom management and diagnosis given to patient on AVS.   Continue to follow with Harmoni Lucus, Eula FriedMichelle S, FNP for routine health maintenance.   Krystal MarrowKristi T Kincy and I agreed with plan.   Jairo BenMichelle Smith Royce Stegman, FNP

## 2021-05-27 LAB — LIPID PANEL W/O CHOL/HDL RATIO
Cholesterol, Total: 215 mg/dL — ABNORMAL HIGH (ref 100–199)
HDL: 40 mg/dL (ref >39)
LDL Chol Calc (NIH): 157 mg/dL — ABNORMAL HIGH (ref 0–99)
Triglycerides: 99 mg/dL (ref 0–149)
VLDL Cholesterol Cal: 18 mg/dL (ref 5–40)

## 2021-05-28 ENCOUNTER — Encounter: Payer: Self-pay | Admitting: Adult Health

## 2021-05-28 ENCOUNTER — Other Ambulatory Visit: Payer: Self-pay | Admitting: Adult Health

## 2021-05-28 DIAGNOSIS — R87610 Atypical squamous cells of undetermined significance on cytologic smear of cervix (ASC-US): Secondary | ICD-10-CM | POA: Insufficient documentation

## 2021-05-28 LAB — CYTOLOGY - PAP
Comment: NEGATIVE
Diagnosis: UNDETERMINED — AB
High risk HPV: NEGATIVE

## 2021-05-28 NOTE — Progress Notes (Signed)
PAP smear shows no HPV, satisfactory specimen, and does have atypical squamous cells. Will need to have her follow up with gynecology does she have preference on location or provider ?

## 2021-05-31 ENCOUNTER — Other Ambulatory Visit: Payer: Self-pay

## 2021-05-31 DIAGNOSIS — Z1389 Encounter for screening for other disorder: Secondary | ICD-10-CM

## 2021-05-31 NOTE — Progress Notes (Signed)
Order for urine has already been placed for future.

## 2021-06-01 ENCOUNTER — Other Ambulatory Visit: Payer: Self-pay

## 2021-06-01 ENCOUNTER — Other Ambulatory Visit (INDEPENDENT_AMBULATORY_CARE_PROVIDER_SITE_OTHER): Payer: BC Managed Care – PPO

## 2021-06-01 DIAGNOSIS — Z1389 Encounter for screening for other disorder: Secondary | ICD-10-CM

## 2021-06-01 LAB — URINALYSIS, ROUTINE W REFLEX MICROSCOPIC
Bilirubin Urine: NEGATIVE
Ketones, ur: NEGATIVE
Nitrite: NEGATIVE
RBC / HPF: NONE SEEN (ref 0–?)
Specific Gravity, Urine: >=1.03 — AB (ref 1.000–1.030)
Total Protein, Urine: NEGATIVE
Urine Glucose: NEGATIVE
Urobilinogen, UA: 0.2 (ref 0.0–1.0)
pH: 5.5 (ref 5.0–8.0)

## 2021-06-02 ENCOUNTER — Other Ambulatory Visit: Payer: Self-pay | Admitting: Adult Health

## 2021-06-02 DIAGNOSIS — R82998 Other abnormal findings in urine: Secondary | ICD-10-CM

## 2021-06-02 DIAGNOSIS — R3129 Other microscopic hematuria: Secondary | ICD-10-CM

## 2021-06-02 DIAGNOSIS — F418 Other specified anxiety disorders: Secondary | ICD-10-CM

## 2021-06-02 NOTE — Progress Notes (Signed)
If clean catch urine, there is some bacteria, and blood. Please send for culture if able. Urine culture placed order. Will wait on culture.

## 2021-06-03 ENCOUNTER — Other Ambulatory Visit: Payer: Self-pay | Admitting: Adult Health

## 2021-06-03 ENCOUNTER — Other Ambulatory Visit: Payer: Self-pay

## 2021-06-03 ENCOUNTER — Other Ambulatory Visit: Payer: BC Managed Care – PPO

## 2021-06-03 DIAGNOSIS — R82998 Other abnormal findings in urine: Secondary | ICD-10-CM

## 2021-06-03 DIAGNOSIS — R3129 Other microscopic hematuria: Secondary | ICD-10-CM

## 2021-06-04 LAB — URINE CULTURE
MICRO NUMBER:: 13018516
SPECIMEN QUALITY:: ADEQUATE

## 2021-09-06 ENCOUNTER — Other Ambulatory Visit: Payer: Self-pay | Admitting: Family Medicine

## 2021-09-06 DIAGNOSIS — Z1231 Encounter for screening mammogram for malignant neoplasm of breast: Secondary | ICD-10-CM

## 2021-10-05 ENCOUNTER — Ambulatory Visit
Admission: RE | Admit: 2021-10-05 | Discharge: 2021-10-05 | Disposition: A | Discharge status: Home / Self Care | Payer: BC Managed Care – PPO | Source: Ambulatory Visit | Attending: Family Medicine | Admitting: Family Medicine

## 2021-10-05 DIAGNOSIS — Z1231 Encounter for screening mammogram for malignant neoplasm of breast: Secondary | ICD-10-CM | POA: Insufficient documentation

## 2022-07-14 ENCOUNTER — Ambulatory Visit
Admission: RE | Admit: 2022-07-14 | Discharge: 2022-07-14 | Disposition: A | Discharge status: Home / Self Care | Payer: BC Managed Care – PPO | Source: Ambulatory Visit | Attending: Family Medicine | Admitting: Family Medicine

## 2022-07-14 ENCOUNTER — Other Ambulatory Visit: Payer: Self-pay | Admitting: Family Medicine

## 2022-07-14 ENCOUNTER — Other Ambulatory Visit: Payer: Self-pay

## 2022-07-14 DIAGNOSIS — M79661 Pain in right lower leg: Secondary | ICD-10-CM | POA: Insufficient documentation

## 2022-07-14 DIAGNOSIS — M7989 Other specified soft tissue disorders: Secondary | ICD-10-CM

## 2023-05-31 ENCOUNTER — Other Ambulatory Visit: Payer: Self-pay | Admitting: Family Medicine

## 2023-05-31 DIAGNOSIS — Z1231 Encounter for screening mammogram for malignant neoplasm of breast: Secondary | ICD-10-CM
